# Patient Record
Sex: Male | Born: 1960 | Race: Black or African American | Hispanic: No | Marital: Single | Smoking: Never smoker
Health system: Southern US, Community
[De-identification: ages and names within clinical notes are randomized; demographics above are authoritative.]

## PROBLEM LIST (undated history)

## (undated) DIAGNOSIS — I10 Essential (primary) hypertension: Secondary | ICD-10-CM

## (undated) HISTORY — PX: KNEE SURGERY: SHX244

---

## 2015-10-19 DIAGNOSIS — R972 Elevated prostate specific antigen [PSA]: Secondary | ICD-10-CM | POA: Insufficient documentation

## 2016-09-28 DIAGNOSIS — M48061 Spinal stenosis, lumbar region without neurogenic claudication: Secondary | ICD-10-CM | POA: Insufficient documentation

## 2016-09-28 DIAGNOSIS — I10 Essential (primary) hypertension: Secondary | ICD-10-CM | POA: Insufficient documentation

## 2016-09-28 DIAGNOSIS — J45909 Unspecified asthma, uncomplicated: Secondary | ICD-10-CM | POA: Insufficient documentation

## 2016-09-28 DIAGNOSIS — G4733 Obstructive sleep apnea (adult) (pediatric): Secondary | ICD-10-CM | POA: Insufficient documentation

## 2016-09-28 DIAGNOSIS — K589 Irritable bowel syndrome without diarrhea: Secondary | ICD-10-CM | POA: Insufficient documentation

## 2016-10-24 DIAGNOSIS — N179 Acute kidney failure, unspecified: Secondary | ICD-10-CM | POA: Insufficient documentation

## 2016-10-24 DIAGNOSIS — E669 Obesity, unspecified: Secondary | ICD-10-CM | POA: Insufficient documentation

## 2016-10-24 DIAGNOSIS — N400 Enlarged prostate without lower urinary tract symptoms: Secondary | ICD-10-CM | POA: Insufficient documentation

## 2016-10-28 DIAGNOSIS — E119 Type 2 diabetes mellitus without complications: Secondary | ICD-10-CM | POA: Insufficient documentation

## 2017-11-02 DIAGNOSIS — I219 Acute myocardial infarction, unspecified: Secondary | ICD-10-CM

## 2017-11-02 HISTORY — DX: Acute myocardial infarction, unspecified: I21.9

## 2018-02-08 ENCOUNTER — Encounter: Payer: Self-pay | Admitting: Emergency Medicine

## 2018-02-08 ENCOUNTER — Other Ambulatory Visit: Payer: Self-pay

## 2018-02-08 ENCOUNTER — Emergency Department: Payer: Federal, State, Local not specified - PPO

## 2018-02-08 ENCOUNTER — Inpatient Hospital Stay
Admission: EM | Admit: 2018-02-08 | Discharge: 2018-02-10 | DRG: 247 | Disposition: A | Discharge status: Home / Self Care | Payer: Federal, State, Local not specified - PPO | Attending: Internal Medicine | Admitting: Internal Medicine

## 2018-02-08 DIAGNOSIS — R7989 Other specified abnormal findings of blood chemistry: Secondary | ICD-10-CM

## 2018-02-08 DIAGNOSIS — Z8249 Family history of ischemic heart disease and other diseases of the circulatory system: Secondary | ICD-10-CM | POA: Diagnosis not present

## 2018-02-08 DIAGNOSIS — I255 Ischemic cardiomyopathy: Secondary | ICD-10-CM | POA: Diagnosis present

## 2018-02-08 DIAGNOSIS — I10 Essential (primary) hypertension: Secondary | ICD-10-CM | POA: Diagnosis present

## 2018-02-08 DIAGNOSIS — Z885 Allergy status to narcotic agent status: Secondary | ICD-10-CM

## 2018-02-08 DIAGNOSIS — E1165 Type 2 diabetes mellitus with hyperglycemia: Secondary | ICD-10-CM | POA: Diagnosis present

## 2018-02-08 DIAGNOSIS — I200 Unstable angina: Secondary | ICD-10-CM

## 2018-02-08 DIAGNOSIS — Z6837 Body mass index (BMI) 37.0-37.9, adult: Secondary | ICD-10-CM | POA: Diagnosis not present

## 2018-02-08 DIAGNOSIS — Z79899 Other long term (current) drug therapy: Secondary | ICD-10-CM

## 2018-02-08 DIAGNOSIS — R079 Chest pain, unspecified: Secondary | ICD-10-CM | POA: Diagnosis present

## 2018-02-08 DIAGNOSIS — I43 Cardiomyopathy in diseases classified elsewhere: Secondary | ICD-10-CM | POA: Diagnosis present

## 2018-02-08 DIAGNOSIS — R778 Other specified abnormalities of plasma proteins: Secondary | ICD-10-CM

## 2018-02-08 DIAGNOSIS — I2510 Atherosclerotic heart disease of native coronary artery without angina pectoris: Secondary | ICD-10-CM | POA: Diagnosis present

## 2018-02-08 DIAGNOSIS — I214 Non-ST elevation (NSTEMI) myocardial infarction: Principal | ICD-10-CM | POA: Diagnosis present

## 2018-02-08 DIAGNOSIS — Z955 Presence of coronary angioplasty implant and graft: Secondary | ICD-10-CM | POA: Diagnosis not present

## 2018-02-08 HISTORY — DX: Essential (primary) hypertension: I10

## 2018-02-08 LAB — BASIC METABOLIC PANEL
Anion gap: 6 (ref 5–15)
BUN: 12 mg/dL (ref 6–20)
CALCIUM: 9.8 mg/dL (ref 8.9–10.3)
CO2: 30 mmol/L (ref 22–32)
CREATININE: 1.1 mg/dL (ref 0.61–1.24)
Chloride: 101 mmol/L (ref 101–111)
GFR calc Af Amer: >60 mL/min (ref >60)
GLUCOSE: 187 mg/dL — AB (ref 65–99)
Potassium: 3.8 mmol/L (ref 3.5–5.1)
SODIUM: 137 mmol/L (ref 135–145)

## 2018-02-08 LAB — TROPONIN I
TROPONIN I: 0.11 ng/mL — AB (ref <0.03)
Troponin I: 0.15 ng/mL (ref <0.03)
Troponin I: 0.14 ng/mL (ref <0.03)
Troponin I: 0.12 ng/mL (ref <0.03)

## 2018-02-08 LAB — GLUCOSE, CAPILLARY
Glucose-Capillary: 130 mg/dL — ABNORMAL HIGH (ref 65–99)
Glucose-Capillary: 123 mg/dL — ABNORMAL HIGH (ref 65–99)
Glucose-Capillary: 117 mg/dL — ABNORMAL HIGH (ref 65–99)

## 2018-02-08 LAB — CBC
HEMATOCRIT: 42.8 % (ref 40.0–52.0)
Hemoglobin: 14.5 g/dL (ref 13.0–18.0)
MCH: 28.2 pg (ref 26.0–34.0)
MCHC: 34 g/dL (ref 32.0–36.0)
MCV: 82.9 fL (ref 80.0–100.0)
PLATELETS: 247 10*3/uL (ref 150–440)
RBC: 5.16 MIL/uL (ref 4.40–5.90)
RDW: 13.1 % (ref 11.5–14.5)
WBC: 7.5 10*3/uL (ref 3.8–10.6)

## 2018-02-08 LAB — HEMOGLOBIN A1C
Hgb A1c MFr Bld: 8.1 % — ABNORMAL HIGH (ref 4.8–5.6)
Mean Plasma Glucose: 185.77 mg/dL

## 2018-02-08 LAB — TSH: TSH: 2.629 u[IU]/mL (ref 0.350–4.500)

## 2018-02-08 MED ORDER — NITROGLYCERIN 2 % TD OINT
1.0000 [in_us] | TOPICAL_OINTMENT | Freq: Once | TRANSDERMAL | Status: AC
  Administered 2018-02-08: 1 [in_us] via TOPICAL
  Filled 2018-02-08: qty 1

## 2018-02-08 MED ORDER — LOSARTAN POTASSIUM 25 MG PO TABS
25.0000 mg | ORAL_TABLET | Freq: Every day | ORAL | Status: DC
  Administered 2018-02-08 – 2018-02-10 (×2): 25 mg via ORAL
  Filled 2018-02-08 (×2): qty 1

## 2018-02-08 MED ORDER — ENOXAPARIN SODIUM 150 MG/ML ~~LOC~~ SOLN
1.0000 mg/kg | Freq: Once | SUBCUTANEOUS | Status: AC
Start: 2018-02-08 — End: 2018-02-08
  Administered 2018-02-08: 135 mg via SUBCUTANEOUS
  Filled 2018-02-08: qty 0.89

## 2018-02-08 MED ORDER — ACETAMINOPHEN 650 MG RE SUPP: 650.0000 mg | Freq: Four times a day (QID) | RECTAL | Status: DC | PRN

## 2018-02-08 MED ORDER — NITROGLYCERIN 0.4 MG SL SUBL
SUBLINGUAL_TABLET | SUBLINGUAL | Status: AC
  Administered 2018-02-08: 0.4 mg via SUBLINGUAL
  Filled 2018-02-08: qty 1

## 2018-02-08 MED ORDER — METOPROLOL TARTRATE 25 MG PO TABS: 25.0000 mg | ORAL_TABLET | Freq: Two times a day (BID) | ORAL | Status: DC

## 2018-02-08 MED ORDER — ONDANSETRON HCL 4 MG/2ML IJ SOLN: 4.0000 mg | Freq: Four times a day (QID) | INTRAMUSCULAR | Status: DC | PRN

## 2018-02-08 MED ORDER — ATORVASTATIN CALCIUM 20 MG PO TABS
40.0000 mg | ORAL_TABLET | Freq: Every day | ORAL | Status: DC
  Administered 2018-02-08 – 2018-02-09 (×2): 40 mg via ORAL
  Filled 2018-02-08 (×2): qty 2

## 2018-02-08 MED ORDER — METOPROLOL TARTRATE 50 MG PO TABS
50.0000 mg | ORAL_TABLET | Freq: Two times a day (BID) | ORAL | Status: DC
  Administered 2018-02-08 – 2018-02-10 (×4): 50 mg via ORAL
  Filled 2018-02-08 (×4): qty 1

## 2018-02-08 MED ORDER — ACETAMINOPHEN 325 MG PO TABS
650.0000 mg | ORAL_TABLET | Freq: Four times a day (QID) | ORAL | Status: DC | PRN
  Administered 2018-02-08: 650 mg via ORAL
  Filled 2018-02-08: qty 2

## 2018-02-08 MED ORDER — SODIUM CHLORIDE 0.9 % IV SOLN: INTRAVENOUS | Status: DC

## 2018-02-08 MED ORDER — DOCUSATE SODIUM 100 MG PO CAPS
100.0000 mg | ORAL_CAPSULE | Freq: Two times a day (BID) | ORAL | Status: DC
  Filled 2018-02-08 (×2): qty 1

## 2018-02-08 MED ORDER — ASPIRIN 81 MG PO CHEW
324.0000 mg | CHEWABLE_TABLET | Freq: Once | ORAL | Status: AC
  Administered 2018-02-08: 324 mg via ORAL
  Filled 2018-02-08: qty 4

## 2018-02-08 MED ORDER — NITROGLYCERIN 0.4 MG SL SUBL
0.4000 mg | SUBLINGUAL_TABLET | SUBLINGUAL | Status: DC | PRN
  Administered 2018-02-08: 0.4 mg via SUBLINGUAL
  Filled 2018-02-08: qty 1

## 2018-02-08 MED ORDER — INSULIN ASPART 100 UNIT/ML ~~LOC~~ SOLN: 0.0000 [IU] | Freq: Three times a day (TID) | SUBCUTANEOUS | Status: DC

## 2018-02-08 MED ORDER — ONDANSETRON HCL 4 MG PO TABS: 4.0000 mg | ORAL_TABLET | Freq: Four times a day (QID) | ORAL | Status: DC | PRN

## 2018-02-08 MED ORDER — NITROGLYCERIN 2 % TD OINT
0.5000 [in_us] | TOPICAL_OINTMENT | Freq: Once | TRANSDERMAL | Status: AC
  Administered 2018-02-08: 0.5 [in_us] via TOPICAL
  Filled 2018-02-08: qty 1

## 2018-02-08 NOTE — H&P (Signed)
Jeffrey Knight is an 57 y.o. male.   Chief Complaint: Chest pain HPI: Patient with past medical history of hypertension presents to the emergency department complaining of chest pain.  The patient states that this is not new but that it has become more frequent.  He becomes very short of breath even with minimal exertion.  The patient reports that ascending 1 flight of stairs is more than enough to elicit centralized chest pain.  It does not radiate nor does he have any associated nausea, vomiting or diaphoresis.  Upon arrival to the emergency department the patient's blood pressure was very high.  Nitropaste was applied to his chest which improved his pressure some and began to relieve his chest pain.  Troponin was found to be elevated and the patient some EKG changes.  Therapeutic Lovenox was started and the hospitalist service contacted for further management.  Past Medical History:  Diagnosis Date  . Hypertension     Past Surgical History:  Procedure Laterality Date  . KNEE SURGERY Left     Family History  Problem Relation Age of Onset  . CAD Mother   . CAD Father    Social History:  reports that he has never smoked. He has never used smokeless tobacco. He reports that he drank alcohol. His drug history is not on file.  Allergies:  Allergies  Allergen Reactions  . Codeine Nausea And Vomiting    Medications Prior to Admission  Medication Sig Dispense Refill  . losartan (COZAAR) 25 MG tablet Take 25 mg by mouth daily.      Results for orders placed or performed during the hospital encounter of 02/08/18 (from the past 48 hour(s))  Basic metabolic panel     Status: Abnormal   Collection Time: 02/08/18  1:17 AM  Result Value Ref Range   Sodium 137 135 - 145 mmol/L   Potassium 3.8 3.5 - 5.1 mmol/L   Chloride 101 101 - 111 mmol/L   CO2 30 22 - 32 mmol/L   Glucose, Bld 187 (H) 65 - 99 mg/dL   BUN 12 6 - 20 mg/dL   Creatinine, Ser 1.10 0.61 - 1.24 mg/dL   Calcium 9.8 8.9 - 10.3  mg/dL   GFR calc non Af Amer >60 >60 mL/min   GFR calc Af Amer >60 >60 mL/min    Comment: (NOTE) The eGFR has been calculated using the CKD EPI equation. This calculation has not been validated in all clinical situations. eGFR's persistently <60 mL/min signify possible Chronic Kidney Disease.    Anion gap 6 5 - 15    Comment: Performed at [facility], [address] [place], [place] [address]  CBC     Status: None   Collection Time: 02/08/18  1:17 AM  Result Value Ref Range   WBC 7.5 3.8 - 10.6 K/uL   RBC 5.16 4.40 - 5.90 MIL/uL   Hemoglobin 14.5 13.0 - 18.0 g/dL   HCT 42.8 40.0 - 52.0 %   MCV 82.9 80.0 - 100.0 fL   MCH 28.2 26.0 - 34.0 pg   MCHC 34.0 32.0 - 36.0 g/dL   RDW 13.1 11.5 - 14.5 %   Platelets 247 150 - 440 K/uL    Comment: Performed at [facility], [address] [place], [place] [address]  Troponin I     Status: Abnormal   Collection Time: 02/08/18  1:17 AM  Result Value Ref Range   Troponin I 0.15 (HH) <0.03 ng/mL    Comment:  CRITICAL RESULT CALLED TO, READ BACK BY AND VERIFIED WITH ANDREA BRYANT ON 02/08/18 AT 0224 JAG Performed at [facility], [address] [place], [place] [address]   TSH     Status: None   Collection Time: 02/08/18  1:17 AM  Result Value Ref Range   TSH 2.629 0.350 - 4.500 uIU/mL    Comment: Performed by a 3rd Generation assay with a functional sensitivity of <=0.01 uIU/mL. Performed at [facility], [address] [place], [place] [address]    Dg Chest 2 View  Result Date: 02/08/2018 CLINICAL DATA:  Midsternal chest pain x2 days EXAM: CHEST - 2 VIEW COMPARISON:  None FINDINGS: Mild cardiac enlargement. No aortic aneurysm. Clear lungs with minimal left basilar atelectasis. No effusion or pneumothorax. Degenerative change along the dorsal spine. IMPRESSION: Mild cardiomegaly without active pulmonary disease. Electronically Signed   By: Ashley Royalty M.D.   On: 02/08/2018 02:43    Review of  Systems  Constitutional: Negative for chills and fever.  HENT: Negative for sore throat and tinnitus.   Eyes: Negative for blurred vision and redness.  Respiratory: Positive for shortness of breath. Negative for cough.   Cardiovascular: Positive for chest pain. Negative for palpitations, orthopnea and PND.  Gastrointestinal: Negative for abdominal pain, diarrhea, nausea and vomiting.  Genitourinary: Negative for dysuria, frequency and urgency.  Musculoskeletal: Negative for joint pain and myalgias.  Skin: Negative for rash.       No lesions  Neurological: Negative for speech change, focal weakness and weakness.  Endo/Heme/Allergies: Does not bruise/bleed easily.       No temperature intolerance  Psychiatric/Behavioral: Negative for depression and suicidal ideas.    Blood pressure (!) 166/106, pulse 81, temperature 97.8 F (36.6 C), temperature source Oral, resp. rate (!) 22, height _0  (1.88 m), weight 133.8 kg (294 lb 14.4 oz), SpO2 100 %. Physical Exam  Vitals reviewed. Constitutional: He is oriented to person, place, and time. He appears well-developed and well-nourished. No distress.  HENT:  Head: Normocephalic and atraumatic.  Mouth/Throat: Oropharynx is clear and moist.  Eyes: Pupils are equal, round, and reactive to light. Conjunctivae and EOM are normal. No scleral icterus.  Neck: Normal range of motion. Neck supple. No JVD present. No tracheal deviation present. No thyromegaly present.  Cardiovascular: Normal rate, regular rhythm and normal heart sounds. Exam reveals no gallop and no friction rub.  No murmur heard. Respiratory: Effort normal and breath sounds normal. No respiratory distress.  GI: Soft. Bowel sounds are normal. He exhibits no distension. There is no tenderness.  Genitourinary:  Genitourinary Comments: Deferred  Musculoskeletal: Normal range of motion. He exhibits no edema.  Lymphadenopathy:    He has no cervical adenopathy.  Neurological: He is alert and  oriented to person, place, and time. No cranial nerve deficit.  Skin: Skin is warm and dry. No rash noted. No erythema.  Psychiatric: He has a normal mood and affect. His behavior is normal. Judgment and thought content normal.     Assessment/Plan This is a 57 year old male admitted for NSTEMI. 1.  NSTEMI: Inversions of T waves in some anterior leads as well as flattening of T waves and lateral leads in light of elevated troponin.  Continue to reduce blood pressure.  Therapeutic Lovenox for now.  The patient is n.p.o. in anticipation of possible heart catheterization.  Consult cardiology.   2.  Hypertension: Uncontrolled; continue losartan.  I have added more Nitropaste to the patient's chest.  Consider addition of a  blocker. 3.  Obesity: BMI 37.8; encouraged healthy diet and exercise. 4.  DVT prophylaxis: Repeating anticoagulation 5.  GI prophylaxis: None The patient is a full code.  Time spent on admission orders and patient care approximately 45 minutes  Harrie Foreman, MD 02/08/2018, 7:57 AM

## 2018-02-08 NOTE — ED Notes (Signed)
ED Provider at bedside. 

## 2018-02-08 NOTE — ED Notes (Signed)
Patient transported to 245 

## 2018-02-08 NOTE — Progress Notes (Signed)
[facility] [place] [facility]                                                                                                                                                                                  Patient Demographics   Jeffrey Knight, is a 57 y.o. male, DOB - January 26, 1961, ZOX:[id number]  Admit date - 02/08/2018   Admitting Physician Arnaldo Natal, MD  Outpatient Primary MD for the patient is [facility]   LOS - 0  Subjective: Patient admitted with chest pain no further chest pain He reports exertional dyspnea and chest pain   Review of Systems:   CONSTITUTIONAL: No documented fever. No fatigue, weakness. No weight gain, no weight loss.  EYES: No blurry or double vision.  ENT: No tinnitus. No postnasal drip. No redness of the oropharynx.  RESPIRATORY: No cough, no wheeze, no hemoptysis.  Positive dyspnea with activity.  CARDIOVASCULAR: Positive chest pain. No orthopnea. No palpitations. No syncope.  GASTROINTESTINAL: No nausea, no vomiting or diarrhea. No abdominal pain. No melena or hematochezia.  GENITOURINARY: No dysuria or hematuria.  ENDOCRINE: No polyuria or nocturia. No heat or cold intolerance.  HEMATOLOGY: No anemia. No bruising. No bleeding.  INTEGUMENTARY: No rashes. No lesions.  MUSCULOSKELETAL: No arthritis. No swelling. No gout.  NEUROLOGIC: No numbness, tingling, or ataxia. No seizure-type activity.  PSYCHIATRIC: No anxiety. No insomnia. No ADD.    Vitals:   Vitals:   02/08/18 0400 02/08/18 0430 02/08/18 0459 02/08/18 0800  BP: (!) 170/103 (!) 183/102 (!) 166/106 (!) 157/94  Pulse: 84 97 81 80  Resp: 12 (!) 22  18  Temp:   97.8 F (36.6 C) 97.8 F (36.6 C)  TempSrc:   Oral Oral  SpO2: 98% 96% 100% 96%  Weight:   133.8 kg (294 lb 14.4 oz)   Height:   6\' 2"  (1.88 m)     Wt Readings from Last 3 Encounters:  02/08/18 133.8 kg (294 lb 14.4 oz)     Intake/Output Summary (Last 24 hours) at 02/08/2018 1426 Last  data filed at 02/08/2018 1410 Gross per 24 hour  Intake 240 ml  Output 450 ml  Net -210 ml    Physical Exam:   GENERAL: Pleasant-appearing in no apparent distress.  HEAD, EYES, EARS, NOSE AND THROAT: Atraumatic, normocephalic. Extraocular muscles are intact. Pupils equal and reactive to light. Sclerae anicteric. No conjunctival injection. No oro-pharyngeal erythema.  NECK: Supple. There is no jugular venous distention. No bruits, no lymphadenopathy, no thyromegaly.  HEART: Regular rate and rhythm,. No murmurs, no rubs, no clicks.  LUNGS: Clear to auscultation bilaterally. No rales or rhonchi. No wheezes.  ABDOMEN:  Soft, flat, nontender, nondistended. Has good bowel sounds. No hepatosplenomegaly appreciated.  EXTREMITIES: No evidence of any cyanosis, clubbing, or peripheral edema.  +2 pedal and radial pulses bilaterally.  NEUROLOGIC: The patient is alert, awake, and oriented x3 with no focal motor or sensory deficits appreciated bilaterally.  SKIN: Moist and warm with no rashes appreciated.  Psych: Not anxious, depressed LN: No inguinal LN enlargement    Antibiotics   Anti-infectives (From admission, onward)   None      Medications   Scheduled Meds: . atorvastatin  40 mg Oral q1800  . docusate sodium  100 mg Oral BID  . insulin aspart  0-9 Units Subcutaneous TID WC  . losartan  25 mg Oral Daily  . metoprolol tartrate  25 mg Oral BID   Continuous Infusions: PRN Meds:.acetaminophen **OR** acetaminophen, ondansetron **OR** ondansetron (ZOFRAN) IV   Data Review:   Micro Results No results found for this or any previous visit (from the past 240 hour(s)).  Radiology Reports Dg Chest 2 View  Result Date: 02/08/2018 CLINICAL DATA:  Midsternal chest pain x2 days EXAM: CHEST - 2 VIEW COMPARISON:  None FINDINGS: Mild cardiac enlargement. No aortic aneurysm. Clear lungs with minimal left basilar atelectasis. No effusion or pneumothorax. Degenerative change along the dorsal spine.  IMPRESSION: Mild cardiomegaly without active pulmonary disease. Electronically Signed   By: Tollie Eth M.D.   On: 02/08/2018 02:43     CBC Recent Labs  Lab 02/08/18 0117  WBC 7.5  HGB 14.5  HCT 42.8  PLT 247  MCV 82.9  MCH 28.2  MCHC 34.0  RDW 13.1    Chemistries  Recent Labs  Lab 02/08/18 0117  NA 137  K 3.8  CL 101  CO2 30  GLUCOSE 187*  BUN 12  CREATININE 1.10  CALCIUM 9.8   ------------------------------------------------------------------------------------------------------------------ estimated creatinine clearance is 111.6 mL/min (by C-G formula based on SCr of 1.1 mg/dL). ------------------------------------------------------------------------------------------------------------------ Recent Labs    02/08/18 0117  HGBA1C 8.1*   ------------------------------------------------------------------------------------------------------------------ No results for input(s): CHOL, HDL, LDLCALC, TRIG, CHOLHDL, LDLDIRECT in the last 72 hours. ------------------------------------------------------------------------------------------------------------------ Recent Labs    02/08/18 0117  TSH 2.629   ------------------------------------------------------------------------------------------------------------------ No results for input(s): VITAMINB12, FOLATE, FERRITIN, TIBC, IRON, RETICCTPCT in the last 72 hours.  Coagulation profile No results for input(s): INR, PROTIME in the last 168 hours.  No results for input(s): DDIMER in the last 72 hours.  Cardiac Enzymes Recent Labs  Lab 02/08/18 0117 02/08/18 0947  TROPONINI 0.15* 0.14*   ------------------------------------------------------------------------------------------------------------------ Invalid input(s): POCBNP    Assessment & Plan  Patient is a 57 year old African-American male presented with chest pain  1.  NSTEMI: Continue Lovenox therapy aspirin, nitroglycerin and Lopressor cardiolgy plans to  cath him tomorrow morning 2.  Hypertension: Uncontrolled; continue losartan.    Will increase metoprolol 3.    Hyperglycemia with a hemoglobin A1c greater than 8 patient is a diabetic started on a sliding scale insulin by me will monitor his blood glucose will likely benefit from metformin on the 4.  Obesity: BMI 37.8; encouraged healthy diet and exercise. 5.  DVT prophylaxis: Lovenox     Code Status Orders  (From admission, onward)        Start     Ordered   02/08/18 0445  Full code  Continuous     02/08/18 0444    Code Status History    This patient has a current code status but no historical code status.    Advance Directive Documentation  Most Recent Value  Type of Advance Directive  Healthcare Power of Attorney, Living will  Pre-existing out of facility DNR order (yellow form or pink MOST form)  -  "MOST" Form in Place?  -           Consults cardiology  DVT Prophylaxis  Lovenox   Lab Results  Component Value Date   PLT 247 02/08/2018     Time Spent in minutes   35 minutes greater than 50% of time spent in care coordination and counseling patient regarding the condition and plan of care.   Auburn Bilberry M.D on 02/08/2018 at 2:26 PM  Between 7am to 6pm - Pager - [phone]  After 6pm go to [email or web address] - Social research officer, government  [employer]   Office  [phone]

## 2018-02-08 NOTE — ED Provider Notes (Signed)
[facility] Emergency Department Provider Note   ____________________________________________   First MD Initiated Contact with Patient 02/08/18 [phone]     (approximate)  I have reviewed the triage vital signs and the nursing notes.   HISTORY  Chief Complaint Chest Pain    HPI Jeffrey Knight is a 57 y.o. male who comes into the hospital today with chest pain.  The patient states this started a couple of days ago.  The patient was walking when it started but it went away when he sat down.  He reports that the pain has been coming and going every time he does something.  Tonight when he laid down the pain started hurting really bad in his mid chest.  The patient also had some radiation into his back.  This was around 1130.  The patient states that he gets short of breath when he is walking and a little lightheaded as well but denies any sweats nausea or vomiting.  The patient states that his pain is currently a 2 out of 10 in intensity and is never had this before.  The patient does have a history of high blood pressure but no other heart problems.  He decided to come into the hospital for evaluation.  The patient did not take any medicine at home.   Past Medical History:  Diagnosis Date  . Hypertension     There are no active problems to display for this patient.   Past Surgical History:  Procedure Laterality Date  . KNEE SURGERY Left     Prior to Admission medications   Not on File    Allergies Codeine  No family history on file.  Social History Social History   Tobacco Use  . Smoking status: Never Smoker  . Smokeless tobacco: Never Used  Substance Use Topics  . Alcohol use: Not Currently  . Drug use: Not on file    Review of Systems  Constitutional: No fever/chills Eyes: No visual changes. ENT: No sore throat. Cardiovascular:  chest pain. Respiratory: shortness of breath. Gastrointestinal: No abdominal pain.  No nausea, no  vomiting.   Genitourinary: Negative for dysuria. Musculoskeletal: Negative for back pain. Skin: Negative for rash. Neurological: Lightheadedness.   ____________________________________________   PHYSICAL EXAM:  VITAL SIGNS: ED Triage Vitals  Enc Vitals Group     BP 02/08/18 0118 (!) 192/109     Pulse Rate 02/08/18 0118 94     Resp 02/08/18 0118 18     Temp 02/08/18 0118 97.9 F (36.6 C)     Temp Source 02/08/18 0118 Oral     SpO2 02/08/18 0118 99 %     Weight 02/08/18 0114 295 lb (133.8 kg)     Height 02/08/18 0114 6\' 2"  (1.88 m)     Head Circumference --      Peak Flow --      Pain Score 02/08/18 0114 8     Pain Loc --      Pain Edu? --      Excl. in GC? --     Constitutional: Alert and oriented. Well appearing and in mild distress. Eyes: Conjunctivae are normal. PERRL. EOMI. Head: Atraumatic. Nose: No congestion/rhinnorhea. Mouth/Throat: Mucous membranes are moist.  Oropharynx non-erythematous. Cardiovascular: Normal rate, regular rhythm. Grossly normal heart sounds.  Good peripheral circulation. Respiratory: Normal respiratory effort.  No retractions. Lungs CTAB. Gastrointestinal: Soft and nontender. No distention.  Positive bowel sounds Musculoskeletal: No lower extremity tenderness nor edema.  Neurologic:  Normal speech  and language.  Skin:  Skin is warm, dry and intact.  Psychiatric: Mood and affect are normal.   ____________________________________________   LABS (all labs ordered are listed, but only abnormal results are displayed)  Labs Reviewed  BASIC METABOLIC PANEL - Abnormal; Notable for the following components:      Result Value   Glucose, Bld 187 (*)    All other components within normal limits  TROPONIN I - Abnormal; Notable for the following components:   Troponin I 0.15 (*)    All other components within normal limits  CBC   ____________________________________________  EKG  ED ECG REPORT I, Rebecka Apley, the attending  physician, personally viewed and interpreted this ECG.   Date: 02/08/2018  EKG Time: 117  Rate: 90  Rhythm: normal sinus rhythm  Axis: normal  Intervals:none  ST&T Change: Flipped T waves in lead I, II and aVL with T wave flattening to lead III and aVF, flipped T waves as well in lead V5 and V6. No comparisons available  ____________________________________________  RADIOLOGY  ED MD interpretation: Chest x-ray: Mild cardiomegaly without active pulmonary disease  Official radiology report(s): Dg Chest 2 View  Result Date: 02/08/2018 CLINICAL DATA:  Midsternal chest pain x2 days EXAM: CHEST - 2 VIEW COMPARISON:  None FINDINGS: Mild cardiac enlargement. No aortic aneurysm. Clear lungs with minimal left basilar atelectasis. No effusion or pneumothorax. Degenerative change along the dorsal spine. IMPRESSION: Mild cardiomegaly without active pulmonary disease. Electronically Signed   By: Tollie Eth M.D.   On: 02/08/2018 02:43    ____________________________________________   PROCEDURES  Procedure(s) performed: None  Procedures  Critical Care performed: No  ____________________________________________   INITIAL IMPRESSION / ASSESSMENT AND PLAN / ED COURSE  As part of my medical decision making, I reviewed the following data within the electronic MEDICAL RECORD NUMBER Notes from prior ED visits and [place] Controlled Substance Database   This is a 57 year old male who comes into the hospital today with some chest pain.  My differential diagnosis includes musculoskeletal pain, reflux, acute coronary syndrome, pulmonary pathology.  We did check some blood work on the patient to include a CBC, BMP and a troponin.  The patient also received an x-ray.  The patient's troponin came back at 0.15 and his glucose is 187.  The patient's chest x-ray is negative.  I will give the patient some aspirin and Nitropaste to his chest to help bring down his blood pressure and some Lovenox.  The patient will  be admitted to the hospitalist service for further evaluation.      ____________________________________________   FINAL CLINICAL IMPRESSION(S) / ED DIAGNOSES  Final diagnoses:  Unstable angina pectoris (HCC)  Elevated troponin     ED Discharge Orders    None       Note:  This document was prepared using Dragon voice recognition software and may include unintentional dictation errors.    Rebecka Apley, MD 02/08/18 [phone]

## 2018-02-08 NOTE — ED Triage Notes (Signed)
Pt to triage via w/c with no distress noted; st x 2 days having mid CP radiating into back with no accomp symptoms; denies hx of same

## 2018-02-08 NOTE — Progress Notes (Signed)
Inpatient Diabetes Program Recommendations  AACE/ADA: New Consensus Statement on Inpatient Glycemic Control (2015)  Target Ranges:  Prepandial:   less than 140 mg/dL      Peak postprandial:   less than 180 mg/dL (1-2 hours)      Critically ill patients:  140 - 180 mg/dL  Results for MAURY, GRONINGER (MRN [id number]) as of 02/08/2018 11:32  Ref. Range 02/08/2018 01:17  Glucose Latest Ref Range: 65 - 99 mg/dL 187 (H)  Hemoglobin A1C Latest Ref Range: 4.8 - 5.6 % 8.1 (H)    Review of Glycemic Control  Diabetes history: No Outpatient Diabetes medications: NA Current orders for Inpatient glycemic control: None  Inpatient Diabetes Program Recommendations:  Correction (SSI): While inpatient, please consider ordering CBGs with Novolog correction scale ACHS. HgbA1C: A1C 8.1% on 02/07/18 indicating an average glucose of 186 mg/dl over the past 2-3 months. In reviewing the chart in [place], patient's A1C was 8.1% on 12/19/2016 and 9.2% on 10/25/16. Per ADA, if A1C is 6.5% or greater, then criteria met to dx with DM. MD, please indicate in note if patient will be newly dx with DM. If so, please inform patient and bedside nursing so patient can be educated while inpatient. Diet: Please add Carb Modified to Heart Healthy diet.  NOTE: Spoke with patient over the phone to inquire about any DM history. Patient reports that he has never been told he had DM and reports no family history of DM. Informed patient that his initial glucose was elevated and an A1C was ordered which is elevated as well. Informed patient that note would be made in the chart to reflect and MD will determine if patient will be dx with DM at this time or not.   Thanks, Barnie Alderman, RN, MSN, CDE Diabetes Coordinator Inpatient Diabetes Program [phone] (Team Pager from 8am to 5pm)

## 2018-02-08 NOTE — Consult Note (Signed)
[facility] Cardiology  CARDIOLOGY CONSULT NOTE  Patient ID: Jeffrey Knight MRN: [id number] DOB/AGE: 55-Sep-1962 55 y.o.  Admit date: 02/08/2018 Referring Physician [place] Primary Physician [facility] Primary Cardiologist  Reason for Consultation non-STEMI  HPI: 57 year old gentleman referred for evaluation of non-STEMI.  The patient reports that he was in his usual state of health until 2 days ago when he first experienced exertional chest pain while walking.  He describes substernal chest tightness, relieved with rest.  The patient had recurrent chest pain at rest last evening, and presented to [facility] ED.  ECG revealed sinus rhythm with T wave inversions in leads V5 and V6.  Troponin is elevated 0.15 and 0.14.  Review of systems complete and found to be negative unless listed above     Past Medical History:  Diagnosis Date  . Hypertension     Past Surgical History:  Procedure Laterality Date  . KNEE SURGERY Left     Medications Prior to Admission  Medication Sig Dispense Refill Last Dose  . losartan (COZAAR) 25 MG tablet Take 25 mg by mouth daily.   02/07/2018 at Unknown time   Social History   Socioeconomic History  . Marital status: Single    Spouse name: Not on file  . Number of children: Not on file  . Years of education: Not on file  . Highest education level: Not on file  Occupational History  . Not on file  Social Needs  . Financial resource strain: Not on file  . Food insecurity:    Worry: Not on file    Inability: Not on file  . Transportation needs:    Medical: Not on file    Non-medical: Not on file  Tobacco Use  . Smoking status: Never Smoker  . Smokeless tobacco: Never Used  Substance and Sexual Activity  . Alcohol use: Not Currently  . Drug use: Not on file  . Sexual activity: Not on file  Lifestyle  . Physical activity:    Days per week: Not on file    Minutes per session: Not on file  . Stress: Not on file  Relationships  . Social connections:    Talks  on phone: Not on file    Gets together: Not on file    Attends religious service: Not on file    Active member of club or organization: Not on file    Attends meetings of clubs or organizations: Not on file    Relationship status: Not on file  . Intimate partner violence:    Fear of current or ex partner: Not on file    Emotionally abused: Not on file    Physically abused: Not on file    Forced sexual activity: Not on file  Other Topics Concern  . Not on file  Social History Narrative  . Not on file    Family History  Problem Relation Age of Onset  . CAD Mother   . CAD Father       Review of systems complete and found to be negative unless listed above      PHYSICAL EXAM  General: Well developed, well nourished, in no acute distress HEENT:  Normocephalic and atramatic Neck:  No JVD.  Lungs: Clear bilaterally to auscultation and percussion. Heart: HRRR . Normal S1 and S2 without gallops or murmurs.  Abdomen: Bowel sounds are positive, abdomen soft and non-tender  Msk:  Back normal, normal gait. Normal strength and tone for age. Extremities: No clubbing, cyanosis or edema.  Neuro: Alert and oriented X 3. Psych:  Good affect, responds appropriately  Labs:   Lab Results  Component Value Date   WBC 7.5 02/08/2018   HGB 14.5 02/08/2018   HCT 42.8 02/08/2018   MCV 82.9 02/08/2018   PLT 247 02/08/2018    Recent Labs  Lab 02/08/18 0117  NA 137  K 3.8  CL 101  CO2 30  BUN 12  CREATININE 1.10  CALCIUM 9.8  GLUCOSE 187*   Lab Results  Component Value Date   TROPONINI 0.14 (HH) 02/08/2018   No results found for: CHOL No results found for: HDL No results found for: LDLCALC No results found for: TRIG No results found for: CHOLHDL No results found for: LDLDIRECT    Radiology: Dg Chest 2 View  Result Date: 02/08/2018 CLINICAL DATA:  Midsternal chest pain x2 days EXAM: CHEST - 2 VIEW COMPARISON:  None FINDINGS: Mild cardiac enlargement. No aortic aneurysm.  Clear lungs with minimal left basilar atelectasis. No effusion or pneumothorax. Degenerative change along the dorsal spine. IMPRESSION: Mild cardiomegaly without active pulmonary disease. Electronically Signed   By: Tollie Eth M.D.   On: 02/08/2018 02:43    EKG: Normal sinus rhythm, with lateral T wave inversions  ASSESSMENT AND PLAN:   1.  New onset chest pain, with exertion and at rest, with abnormal ECG, and elevated troponin, consistent with unstable angina/non-ST elevation myocardial infarction  Recommendations  1.  Agree with current therapy 2.  Add metoprolol tartrate 25 mg twice daily 3.  If the patient has recurrent chest pain start heparin drip 4.  Cardiac catheterization with selective coronary arteriography scheduled for 02/09/2018.  The risks, benefits alternatives of cardiac catheterization and possible PCI were explained to the patient and informed consent was obtained.  Signed: Marcina Millard MD,PhD, [facility] 02/08/2018, 1:03 PM

## 2018-02-08 NOTE — ED Notes (Signed)
Patient is resting comfortably at this time with no signs of distress present. Equal, unlabored rise and fall of chest noted within normal rate. VS stable. Will continue to monitor.   

## 2018-02-09 ENCOUNTER — Inpatient Hospital Stay
Admit: 2018-02-09 | Discharge: 2018-02-09 | Disposition: A | Discharge status: Home / Self Care | Payer: Federal, State, Local not specified - PPO | Attending: Internal Medicine | Admitting: Internal Medicine

## 2018-02-09 ENCOUNTER — Encounter
Admission: EM | Disposition: A | Discharge status: Home / Self Care | Payer: Self-pay | Source: Home / Self Care | Attending: Internal Medicine

## 2018-02-09 DIAGNOSIS — I214 Non-ST elevation (NSTEMI) myocardial infarction: Secondary | ICD-10-CM

## 2018-02-09 DIAGNOSIS — Z9889 Other specified postprocedural states: Secondary | ICD-10-CM | POA: Insufficient documentation

## 2018-02-09 HISTORY — PX: LEFT HEART CATH AND CORONARY ANGIOGRAPHY: CATH118249

## 2018-02-09 HISTORY — PX: CORONARY STENT INTERVENTION: CATH118234

## 2018-02-09 LAB — ECHOCARDIOGRAM COMPLETE
HEIGHTINCHES: 74 in
Weight: 4677.28 oz

## 2018-02-09 LAB — GLUCOSE, CAPILLARY
Glucose-Capillary: 120 mg/dL — ABNORMAL HIGH (ref 65–99)
Glucose-Capillary: 87 mg/dL (ref 65–99)
Glucose-Capillary: 143 mg/dL — ABNORMAL HIGH (ref 65–99)

## 2018-02-09 LAB — LIPID PANEL
CHOL/HDL RATIO: 5 ratio
CHOLESTEROL: 180 mg/dL (ref 0–200)
HDL: 36 mg/dL — ABNORMAL LOW (ref >40)
LDL CALC: 106 mg/dL — AB (ref 0–99)
Triglycerides: 189 mg/dL — ABNORMAL HIGH (ref <150)
VLDL: 38 mg/dL (ref 0–40)

## 2018-02-09 LAB — POCT ACTIVATED CLOTTING TIME
Activated Clotting Time: 224 seconds
Activated Clotting Time: 230 seconds
Activated Clotting Time: 252 seconds

## 2018-02-09 SURGERY — LEFT HEART CATH AND CORONARY ANGIOGRAPHY
Anesthesia: Moderate Sedation

## 2018-02-09 MED ORDER — ASPIRIN 81 MG PO CHEW
81.0000 mg | CHEWABLE_TABLET | ORAL | Status: AC
  Administered 2018-02-09: 81 mg via ORAL
  Filled 2018-02-09: qty 1

## 2018-02-09 MED ORDER — NITROGLYCERIN 5 MG/ML IV SOLN
INTRAVENOUS | Status: AC
  Filled 2018-02-09: qty 10

## 2018-02-09 MED ORDER — CLOPIDOGREL BISULFATE 75 MG PO TABS
ORAL_TABLET | ORAL | Status: AC
  Filled 2018-02-09: qty 8

## 2018-02-09 MED ORDER — MIDAZOLAM HCL 2 MG/2ML IJ SOLN
INTRAMUSCULAR | Status: DC | PRN
  Administered 2018-02-09: 0.5 mg via INTRAVENOUS
  Administered 2018-02-09: 1 mg via INTRAVENOUS
  Administered 2018-02-09: 0.5 mg via INTRAVENOUS

## 2018-02-09 MED ORDER — CLOPIDOGREL BISULFATE 75 MG PO TABS
75.0000 mg | ORAL_TABLET | Freq: Every day | ORAL | Status: DC
  Administered 2018-02-10: 75 mg via ORAL
  Filled 2018-02-09: qty 1

## 2018-02-09 MED ORDER — SODIUM CHLORIDE 0.9% FLUSH: 3.0000 mL | INTRAVENOUS | Status: DC | PRN

## 2018-02-09 MED ORDER — ONDANSETRON HCL 4 MG/2ML IJ SOLN: 4.0000 mg | Freq: Four times a day (QID) | INTRAMUSCULAR | Status: DC | PRN

## 2018-02-09 MED ORDER — MIDAZOLAM HCL 2 MG/2ML IJ SOLN
INTRAMUSCULAR | Status: AC
  Filled 2018-02-09: qty 2

## 2018-02-09 MED ORDER — ASPIRIN 81 MG PO CHEW
81.0000 mg | CHEWABLE_TABLET | Freq: Every day | ORAL | Status: DC
  Administered 2018-02-09 – 2018-02-10 (×2): 81 mg via ORAL
  Filled 2018-02-09: qty 1

## 2018-02-09 MED ORDER — VERAPAMIL HCL 2.5 MG/ML IV SOLN
INTRAVENOUS | Status: DC | PRN
  Administered 2018-02-09: 2.5 mg via INTRA_ARTERIAL

## 2018-02-09 MED ORDER — HEPARIN SODIUM (PORCINE) 1000 UNIT/ML IJ SOLN
INTRAMUSCULAR | Status: AC
  Filled 2018-02-09: qty 1

## 2018-02-09 MED ORDER — TAMSULOSIN HCL 0.4 MG PO CAPS
0.4000 mg | ORAL_CAPSULE | Freq: Once | ORAL | Status: AC
  Administered 2018-02-09: 0.4 mg via ORAL
  Filled 2018-02-09: qty 1

## 2018-02-09 MED ORDER — HYDRALAZINE HCL 20 MG/ML IJ SOLN
5.0000 mg | INTRAMUSCULAR | Status: AC | PRN
  Administered 2018-02-09 (×2): 5 mg via INTRAVENOUS

## 2018-02-09 MED ORDER — HEPARIN (PORCINE) IN NACL 2-0.9 UNIT/ML-% IJ SOLN
INTRAMUSCULAR | Status: AC
  Filled 2018-02-09: qty 1000

## 2018-02-09 MED ORDER — SODIUM CHLORIDE 0.9 % WEIGHT BASED INFUSION
1.0000 mL/kg/h | INTRAVENOUS | Status: AC
  Administered 2018-02-09: 1 mL/kg/h via INTRAVENOUS

## 2018-02-09 MED ORDER — LABETALOL HCL 5 MG/ML IV SOLN: 10.0000 mg | INTRAVENOUS | Status: AC | PRN

## 2018-02-09 MED ORDER — SODIUM CHLORIDE 0.9 % WEIGHT BASED INFUSION: 1.0000 mL/kg/h | INTRAVENOUS | Status: DC

## 2018-02-09 MED ORDER — FENTANYL CITRATE (PF) 100 MCG/2ML IJ SOLN
INTRAMUSCULAR | Status: DC | PRN
  Administered 2018-02-09: 25 ug via INTRAVENOUS
  Administered 2018-02-09: 50 ug via INTRAVENOUS
  Administered 2018-02-09: 25 ug via INTRAVENOUS

## 2018-02-09 MED ORDER — FENTANYL CITRATE (PF) 100 MCG/2ML IJ SOLN
INTRAMUSCULAR | Status: AC
  Filled 2018-02-09: qty 2

## 2018-02-09 MED ORDER — LIVING WELL WITH DIABETES BOOK
Freq: Once | Status: AC
  Administered 2018-02-09: 16:00:00
  Filled 2018-02-09: qty 1

## 2018-02-09 MED ORDER — HYDRALAZINE HCL 20 MG/ML IJ SOLN
INTRAMUSCULAR | Status: AC
  Administered 2018-02-09: 11:00:00
  Filled 2018-02-09: qty 1

## 2018-02-09 MED ORDER — PERFLUTREN LIPID MICROSPHERE
1.0000 mL | INTRAVENOUS | Status: AC | PRN
  Administered 2018-02-09: 3 mL via INTRAVENOUS
  Filled 2018-02-09: qty 10

## 2018-02-09 MED ORDER — CLOPIDOGREL BISULFATE 75 MG PO TABS
ORAL_TABLET | ORAL | Status: DC | PRN
  Administered 2018-02-09: 600 mg via ORAL

## 2018-02-09 MED ORDER — ACETAMINOPHEN 325 MG PO TABS: 650.0000 mg | ORAL_TABLET | ORAL | Status: DC | PRN

## 2018-02-09 MED ORDER — VERAPAMIL HCL 2.5 MG/ML IV SOLN
INTRAVENOUS | Status: AC
  Filled 2018-02-09: qty 2

## 2018-02-09 MED ORDER — IOPAMIDOL (ISOVUE-300) INJECTION 61%
INTRAVENOUS | Status: DC | PRN
  Administered 2018-02-09: 265 mL via INTRA_ARTERIAL

## 2018-02-09 MED ORDER — TAMSULOSIN HCL 0.4 MG PO CAPS
0.4000 mg | ORAL_CAPSULE | Freq: Every day | ORAL | Status: DC
  Administered 2018-02-09 – 2018-02-10 (×2): 0.4 mg via ORAL
  Filled 2018-02-09 (×2): qty 1

## 2018-02-09 MED ORDER — SODIUM CHLORIDE 0.9% FLUSH
3.0000 mL | Freq: Two times a day (BID) | INTRAVENOUS | Status: DC
  Administered 2018-02-10: 3 mL via INTRAVENOUS

## 2018-02-09 MED ORDER — SODIUM CHLORIDE 0.9 % WEIGHT BASED INFUSION
3.0000 mL/kg/h | INTRAVENOUS | Status: AC
  Administered 2018-02-09: 3 mL/kg/h via INTRAVENOUS

## 2018-02-09 MED ORDER — SODIUM CHLORIDE 0.9 % WEIGHT BASED INFUSION
1.0000 mL/kg/h | INTRAVENOUS | Status: DC
  Administered 2018-02-09: 1 mL/kg/h via INTRAVENOUS

## 2018-02-09 MED ORDER — SODIUM CHLORIDE 0.9 % IV SOLN: 250.0000 mL | INTRAVENOUS | Status: DC | PRN

## 2018-02-09 MED ORDER — ASPIRIN 81 MG PO CHEW: 81.0000 mg | CHEWABLE_TABLET | ORAL | Status: DC

## 2018-02-09 MED ORDER — HEPARIN SODIUM (PORCINE) 1000 UNIT/ML IJ SOLN
INTRAMUSCULAR | Status: DC | PRN
  Administered 2018-02-09 (×2): 3000 [IU] via INTRAVENOUS
  Administered 2018-02-09: 4000 [IU] via INTRAVENOUS
  Administered 2018-02-09: 6500 [IU] via INTRAVENOUS

## 2018-02-09 MED ORDER — SODIUM CHLORIDE 0.9 % WEIGHT BASED INFUSION: 3.0000 mL/kg/h | INTRAVENOUS | Status: DC

## 2018-02-09 MED ORDER — SODIUM CHLORIDE 0.9% FLUSH
3.0000 mL | Freq: Two times a day (BID) | INTRAVENOUS | Status: DC
  Administered 2018-02-09: 3 mL via INTRAVENOUS

## 2018-02-09 MED ORDER — NITROGLYCERIN 1 MG/10 ML FOR IR/CATH LAB
INTRA_ARTERIAL | Status: DC | PRN
  Administered 2018-02-09: 200 ug via INTRACORONARY

## 2018-02-09 MED ORDER — ASPIRIN 81 MG PO CHEW
CHEWABLE_TABLET | ORAL | Status: AC
  Filled 2018-02-09: qty 3

## 2018-02-09 MED ORDER — ASPIRIN 81 MG PO CHEW
CHEWABLE_TABLET | ORAL | Status: DC | PRN
  Administered 2018-02-09: 243 mg via ORAL

## 2018-02-09 SURGICAL SUPPLY — 16 items
BALLN TREK RX 2.5X15 (BALLOONS) ×3
BALLN ~~LOC~~ TREK RX 3.5X20 (BALLOONS) ×3
BALLOON TREK RX 2.5X15 (BALLOONS) ×1 IMPLANT
BALLOON ~~LOC~~ TREK RX 3.5X20 (BALLOONS) ×1 IMPLANT
CATH 5F 110X4 TIG (CATHETERS) ×3 IMPLANT
CATH INFINITI 5FR ANG PIGTAIL (CATHETERS) ×3 IMPLANT
CATH VISTA GUIDE 6FR JR4 (CATHETERS) ×3 IMPLANT
DEVICE INFLAT 30 PLUS (MISCELLANEOUS) ×3 IMPLANT
DEVICE RAD COMP TR BAND LRG (VASCULAR PRODUCTS) ×3 IMPLANT
GLIDESHEATH SLEND A-KIT 6F 22G (SHEATH) IMPLANT
KIT MANI 3VAL PERCEP (MISCELLANEOUS) ×3 IMPLANT
PACK CARDIAC CATH (CUSTOM PROCEDURE TRAY) ×3 IMPLANT
SHEATH RAIN RADIAL 21G 6FR (SHEATH) ×3 IMPLANT
STENT SIERRA 3.00 X 23 MM (Permanent Stent) ×3 IMPLANT
WIRE ASAHI PROWATER 180CM (WIRE) ×3 IMPLANT
WIRE ROSEN-J .035X260CM (WIRE) ×3 IMPLANT

## 2018-02-09 NOTE — Progress Notes (Signed)
Inpatient Diabetes Program Recommendations  AACE/ADA: New Consensus Statement on Inpatient Glycemic Control (2015)  Target Ranges:  Prepandial:   less than 140 mg/dL      Peak postprandial:   less than 180 mg/dL (1-2 hours)      Critically ill patients:  140 - 180 mg/dL  Results for ABDULQADIR, Jeffrey Knight (MRN [id number]) as of 02/09/2018 14:53  Ref. Range 02/08/2018 11:50 02/08/2018 16:41 02/08/2018 20:55 02/09/2018 07:59  Glucose-Capillary Latest Ref Range: 65 - 99 mg/dL 725 (H) 366 (H) 440 (H) 120 (H)   Results for Jeffrey Knight, Jeffrey Knight (MRN [id number]) as of 02/09/2018 14:53  Ref. Range 02/08/2018 01:17  Hemoglobin A1C Latest Ref Range: 4.8 - 5.6 % 8.1 (H)   Review of Glycemic Control  Diabetes history: No Outpatient Diabetes medications: NA Current orders for Inpatient glycemic control: None  Inpatient Diabetes Program Recommendations:  HgbA1C: A1C 8.1% on 02/07/18 indicating an average glucose of 186 mg/dl over the past 2-3 months. In reviewing the chart in Care Everywhere, patient's A1C was 8.1% on 12/19/2016 and 9.2% on 10/25/16. Per MD notes, patient is being newly dx with DM at this time. Bedside nursing please use each patient interaction to provide DM education.   NOTE: Spoke with patient over the phone on 02/08/18 to inquire about any DM history. Patient reported that he has never been told he had DM and reports no family history of DM. Informed patient that his initial glucose was elevated and an A1C was ordered which is elevated as well. Noted MD note that patient will be newly dx with DM during this admission.  Patient has been in cath lab today and had stent placed. Spoke with RN and asked that Living Well with Diabetes book be provided to patient and asked to read the book before Inpatient Diabetes Coordinator sees patient tomorrow on 02/10/18.   Thanks, Orlando Penner, RN, MSN, CDE Diabetes Coordinator Inpatient Diabetes Program [phone] (Team Pager from 8am to 5pm)

## 2018-02-09 NOTE — Progress Notes (Signed)
Dr. Darrold Junker in briefly to speak with pt. & spouse. Pt. Denies any cardiac or subjective c/o at present.

## 2018-02-09 NOTE — Progress Notes (Signed)
[employer] - [place] at [facility]                                                                                                                                                                                  Patient Demographics   Jeffrey Knight, is a 57 y.o. male, DOB - November 08, 1960, ZOX:[id number]  Admit date - 02/08/2018   Admitting Physician Arnaldo Natal, MD  Outpatient Primary MD for the patient is [facility]   LOS - 1  Subjective: Patient seen post cath had a stent placed to the RCA Currently asymptomatic  Review of Systems:   CONSTITUTIONAL: No documented fever. No fatigue, weakness. No weight gain, no weight loss.  EYES: No blurry or double vision.  ENT: No tinnitus. No postnasal drip. No redness of the oropharynx.  RESPIRATORY: No cough, no wheeze, no hemoptysis.  Positive dyspnea with activity.  CARDIOVASCULAR: Positive chest pain. No orthopnea. No palpitations. No syncope.  GASTROINTESTINAL: No nausea, no vomiting or diarrhea. No abdominal pain. No melena or hematochezia.  GENITOURINARY: No dysuria or hematuria.  ENDOCRINE: No polyuria or nocturia. No heat or cold intolerance.  HEMATOLOGY: No anemia. No bruising. No bleeding.  INTEGUMENTARY: No rashes. No lesions.  MUSCULOSKELETAL: No arthritis. No swelling. No gout.  NEUROLOGIC: No numbness, tingling, or ataxia. No seizure-type activity.  PSYCHIATRIC: No anxiety. No insomnia. No ADD.    Vitals:   Vitals:   02/09/18 1300 02/09/18 1330 02/09/18 1345 02/09/18 1429  BP: (!) 154/85   (!) 152/98  Pulse: 86 88 88 94  Resp: 10  14 18   Temp:    98 F (36.7 C)  TempSrc:    Oral  SpO2: 99%   99%  Weight:      Height:        Wt Readings from Last 3 Encounters:  02/09/18 132.6 kg (292 lb 5.3 oz)     Intake/Output Summary (Last 24 hours) at 02/09/2018 1500 Last data filed at 02/09/2018 1100 Gross per 24 hour  Intake 240 ml  Output 1075 ml  Net -835 ml    Physical Exam:    GENERAL: Pleasant-appearing in no apparent distress.  HEAD, EYES, EARS, NOSE AND THROAT: Atraumatic, normocephalic. Extraocular muscles are intact. Pupils equal and reactive to light. Sclerae anicteric. No conjunctival injection. No oro-pharyngeal erythema.  NECK: Supple. There is no jugular venous distention. No bruits, no lymphadenopathy, no thyromegaly.  HEART: Regular rate and rhythm,. No murmurs, no rubs, no clicks.  LUNGS: Clear to auscultation bilaterally. No rales or rhonchi. No wheezes.  ABDOMEN: Soft, flat, nontender, nondistended. Has good bowel sounds. No hepatosplenomegaly appreciated.  EXTREMITIES: No evidence of any cyanosis,  clubbing, or peripheral edema.  +2 pedal and radial pulses bilaterally.  NEUROLOGIC: The patient is alert, awake, and oriented x3 with no focal motor or sensory deficits appreciated bilaterally.  SKIN: Moist and warm with no rashes appreciated.  Psych: Not anxious, depressed LN: No inguinal LN enlargement    Antibiotics   Anti-infectives (From admission, onward)   None      Medications   Scheduled Meds: . aspirin  81 mg Oral Daily  . atorvastatin  40 mg Oral q1800  . [START ON 02/10/2018] clopidogrel  75 mg Oral Q breakfast  . docusate sodium  100 mg Oral BID  . insulin aspart  0-9 Units Subcutaneous TID WC  . living well with diabetes book   Does not apply Once  . losartan  25 mg Oral Daily  . metoprolol tartrate  50 mg Oral BID  . sodium chloride flush  3 mL Intravenous Q12H   Continuous Infusions: . sodium chloride    . sodium chloride     PRN Meds:.sodium chloride, acetaminophen **OR** acetaminophen, acetaminophen, hydrALAZINE, labetalol, nitroGLYCERIN, ondansetron **OR** ondansetron (ZOFRAN) IV, ondansetron (ZOFRAN) IV, sodium chloride flush   Data Review:   Micro Results No results found for this or any previous visit (from the past 240 hour(s)).  Radiology Reports Dg Chest 2 View  Result Date: 02/08/2018 CLINICAL DATA:   Midsternal chest pain x2 days EXAM: CHEST - 2 VIEW COMPARISON:  None FINDINGS: Mild cardiac enlargement. No aortic aneurysm. Clear lungs with minimal left basilar atelectasis. No effusion or pneumothorax. Degenerative change along the dorsal spine. IMPRESSION: Mild cardiomegaly without active pulmonary disease. Electronically Signed   By: Tollie Eth M.D.   On: 02/08/2018 02:43     CBC Recent Labs  Lab 02/08/18 0117  WBC 7.5  HGB 14.5  HCT 42.8  PLT 247  MCV 82.9  MCH 28.2  MCHC 34.0  RDW 13.1    Chemistries  Recent Labs  Lab 02/08/18 0117  NA 137  K 3.8  CL 101  CO2 30  GLUCOSE 187*  BUN 12  CREATININE 1.10  CALCIUM 9.8   ------------------------------------------------------------------------------------------------------------------ estimated creatinine clearance is 111.2 mL/min (by C-G formula based on SCr of 1.1 mg/dL). ------------------------------------------------------------------------------------------------------------------ Recent Labs    02/08/18 0117  HGBA1C 8.1*   ------------------------------------------------------------------------------------------------------------------ Recent Labs    02/09/18 0431  CHOL 180  HDL 36*  LDLCALC 106*  TRIG 189*  CHOLHDL 5.0   ------------------------------------------------------------------------------------------------------------------ Recent Labs    02/08/18 0117  TSH 2.629   ------------------------------------------------------------------------------------------------------------------ No results for input(s): VITAMINB12, FOLATE, FERRITIN, TIBC, IRON, RETICCTPCT in the last 72 hours.  Coagulation profile No results for input(s): INR, PROTIME in the last 168 hours.  No results for input(s): DDIMER in the last 72 hours.  Cardiac Enzymes Recent Labs  Lab 02/08/18 0947 02/08/18 1637 02/08/18 2301  TROPONINI 0.14* 0.12* 0.11*    ------------------------------------------------------------------------------------------------------------------ Invalid input(s): POCBNP    Assessment & Plan  Patient is a 57 year old African-American male presented with chest pain  1.  NSTEMI:  Continue aspirin, Plavix and metoprolol  2.  Hypertension: Uncontrolled; increase Cozaar and metoprolol 3.    Hyperglycemia with a hemoglobin A1c greater than 8 patient is a diabetic started on a sliding scale insulin by me will monitor his blood glucose will likely benefit from metformin on discharge 4.  Obesity: BMI 37.8; encouraged healthy diet and exercise. 5.  DVT prophylaxis: Lovenox     Code Status Orders  (From admission, onward)  Start     Ordered   02/08/18 0445  Full code  Continuous     02/08/18 0444    Code Status History    This patient has a current code status but no historical code status.    Advance Directive Documentation     Most Recent Value  Type of Advance Directive  Healthcare Power of Attorney, Living will  Pre-existing out of facility DNR order (yellow form or pink MOST form)  -  "MOST" Form in Place?  -           Consults cardiology  DVT Prophylaxis  Lovenox   Lab Results  Component Value Date   PLT 247 02/08/2018     Time Spent in minutes   35 minutes greater than 50% of time spent in care coordination and counseling patient regarding the condition and plan of care.   Auburn Bilberry M.D on 02/09/2018 at 3:00 PM  Between 7am to 6pm - Pager - [phone]  After 6pm go to [email or web address] - Social research officer, government  [employer]   Office  [phone]

## 2018-02-09 NOTE — Progress Notes (Signed)
Pt reports 7/10 chest pain after lying on his left side. Pts BP 183/122. SL nitro given. MD Anne Hahn made aware. EKG and one inch nitro paste ordered. Pt pain subsided to 2/10 and BP decreased to 143/96. Will continue to monitor.

## 2018-02-10 ENCOUNTER — Encounter: Payer: Self-pay | Admitting: Cardiology

## 2018-02-10 DIAGNOSIS — Z955 Presence of coronary angioplasty implant and graft: Secondary | ICD-10-CM

## 2018-02-10 LAB — CBC
HCT: 38.6 % — ABNORMAL LOW (ref 40.0–52.0)
Hemoglobin: 13 g/dL (ref 13.0–18.0)
MCH: 28.6 pg (ref 26.0–34.0)
MCHC: 33.7 g/dL (ref 32.0–36.0)
MCV: 84.7 fL (ref 80.0–100.0)
PLATELETS: 235 10*3/uL (ref 150–440)
RBC: 4.55 MIL/uL (ref 4.40–5.90)
RDW: 13.1 % (ref 11.5–14.5)
WBC: 7.7 10*3/uL (ref 3.8–10.6)

## 2018-02-10 LAB — BASIC METABOLIC PANEL
Anion gap: 5 (ref 5–15)
BUN: 12 mg/dL (ref 6–20)
CHLORIDE: 104 mmol/L (ref 101–111)
CO2: 27 mmol/L (ref 22–32)
CREATININE: 1.22 mg/dL (ref 0.61–1.24)
Calcium: 8.6 mg/dL — ABNORMAL LOW (ref 8.9–10.3)
GFR calc Af Amer: >60 mL/min (ref >60)
GLUCOSE: 129 mg/dL — AB (ref 65–99)
POTASSIUM: 3.8 mmol/L (ref 3.5–5.1)
Sodium: 136 mmol/L (ref 135–145)

## 2018-02-10 LAB — GLUCOSE, CAPILLARY: Glucose-Capillary: 114 mg/dL — ABNORMAL HIGH (ref 65–99)

## 2018-02-10 MED ORDER — BLOOD GLUCOSE MONITOR KIT: PACK | 0 refills | Status: AC

## 2018-02-10 MED ORDER — ASPIRIN 81 MG PO CHEW: 81.0000 mg | CHEWABLE_TABLET | Freq: Every day | ORAL | Status: DC

## 2018-02-10 MED ORDER — NITROGLYCERIN 0.4 MG SL SUBL: 0.4000 mg | SUBLINGUAL_TABLET | SUBLINGUAL | 12 refills | Status: AC | PRN

## 2018-02-10 MED ORDER — LOSARTAN POTASSIUM 25 MG PO TABS: 25.0000 mg | ORAL_TABLET | Freq: Every day | ORAL | 0 refills | Status: AC

## 2018-02-10 MED ORDER — CLOPIDOGREL BISULFATE 75 MG PO TABS: 75.0000 mg | ORAL_TABLET | Freq: Every day | ORAL | 2 refills | Status: DC

## 2018-02-10 MED ORDER — ATORVASTATIN CALCIUM 40 MG PO TABS: 40.0000 mg | ORAL_TABLET | Freq: Every day | ORAL | 0 refills | Status: DC

## 2018-02-10 MED ORDER — CLOPIDOGREL BISULFATE 75 MG PO TABS: 75.0000 mg | ORAL_TABLET | Freq: Every day | ORAL | 2 refills | Status: AC

## 2018-02-10 MED ORDER — METOPROLOL TARTRATE 50 MG PO TABS: 50.0000 mg | ORAL_TABLET | Freq: Two times a day (BID) | ORAL | 2 refills | Status: DC

## 2018-02-10 MED ORDER — METFORMIN HCL 500 MG PO TABS: 500.0000 mg | ORAL_TABLET | Freq: Two times a day (BID) | ORAL | 11 refills | Status: DC

## 2018-02-10 MED ORDER — NITROGLYCERIN 0.4 MG SL SUBL: 0.4000 mg | SUBLINGUAL_TABLET | SUBLINGUAL | 12 refills | Status: DC | PRN

## 2018-02-10 MED ORDER — ATORVASTATIN CALCIUM 40 MG PO TABS: 40.0000 mg | ORAL_TABLET | Freq: Every day | ORAL | 0 refills | Status: AC

## 2018-02-10 MED ORDER — METFORMIN HCL 500 MG PO TABS: 500.0000 mg | ORAL_TABLET | Freq: Two times a day (BID) | ORAL | 11 refills | Status: AC

## 2018-02-10 MED ORDER — METOPROLOL TARTRATE 50 MG PO TABS: 50.0000 mg | ORAL_TABLET | Freq: Two times a day (BID) | ORAL | 2 refills | Status: AC

## 2018-02-10 MED ORDER — ASPIRIN 81 MG PO CHEW: 81.0000 mg | CHEWABLE_TABLET | Freq: Every day | ORAL | Status: AC

## 2018-02-10 NOTE — Progress Notes (Signed)
[employer] - [place] at [facility]    Doniven Kamm was admitted to the Hospital on 02/08/2018 and Discharged  02/10/2018 and should be excused from work/school   for 14 days starting 02/08/2018 , may return to work/school without any restrictions.  Call Auburn Bilberry MD with questions.  Auburn Bilberry M.D on 02/10/2018,at 1:03 PM  [employer] - [place] at [facility]  [phone]

## 2018-02-10 NOTE — Progress Notes (Signed)
Nutrition Education Note  RD consulted for nutrition education regarding a Heart Healthy diet.   57 y/o male with NSTEMI   Lipid Panel     Component Value Date/Time   CHOL 180 02/09/2018 0431   TRIG 189 (H) 02/09/2018 0431   HDL 36 (L) 02/09/2018 0431   CHOLHDL 5.0 02/09/2018 0431   VLDL 38 02/09/2018 0431   LDLCALC 106 (H) 02/09/2018 0431    RD provided "Heart Healthy Nutrition Therapy" handout from the Academy of Nutrition and Dietetics. Reviewed patient's dietary recall. Provided examples on ways to decrease sodium and fat intake in diet. Discouraged intake of processed foods and use of salt shaker. Encouraged fresh fruits and vegetables as well as whole grain sources of carbohydrates to maximize fiber intake. Teach back method used.  Expect poor compliance.  Pt meets criteria for obesity based on current BMI.  Current diet order is HH, patient is consuming approximately 100% of meals at this time. Labs and medications reviewed. No further nutrition interventions warranted at this time. RD contact information provided. If additional nutrition issues arise, please re-consult RD.  Betsey Holiday MS, RD, LDN Pager #- [phone] After Hours Pager: [phone]

## 2018-02-10 NOTE — Progress Notes (Signed)
Pt discharged home with wife, IV's removed, pt education completed, for radial site care, and discharge instructions, prescriptions in hand, no complications or complaints at this time. VSS.

## 2018-02-10 NOTE — Progress Notes (Signed)
Inpatient Diabetes Program Recommendations  AACE/ADA: New Consensus Statement on Inpatient Glycemic Control (2015)  Target Ranges:  Prepandial:   less than 140 mg/dL      Peak postprandial:   less than 180 mg/dL (1-2 hours)      Critically ill patients:  140 - 180 mg/dL   Results for Jeffrey Knight, Jeffrey Knight (MRN [id number]) as of 02/10/2018 12:07  Ref. Range 02/08/2018 01:17  Hemoglobin A1C Latest Ref Range: 4.8 - 5.6 % 8.1 (H)   Review of Glycemic Control  Diabetes history:No Outpatient Diabetes medications:NA Current orders for Inpatient glycemic control:None  Inpatient Diabetes Program Recommendations: HgbA1C: A1C 8.1% on 4/8/19indicating an average glucose of 186mg /dl over the past 2-3 months. In reviewing the chart in Care Everywhere, patient's A1C was 8.1% on 12/19/2016 and 9.2% on 10/25/16. Per MD notes, patient is being newly dx with DM at this time. Bedside nursing please use each patient interaction to provide DM education.   NOTE: Spoke with patient about new diabetes diagnosis.  Discussed A1C results (8.1% on 02/08/18) and explained what an A1C is and informed patient that his current A1C indicates an average glucose of 186 mg/dl over the past 2-3 months. Discussed basic pathophysiology of DM Type 2, basic home care, importance of checking CBGs and maintaining good CBG control to prevent long-term and short-term complications. Reviewed glucose and A1C goals.  Reviewed signs and symptoms of hyperglycemia and hypoglycemia along with treatment for both. Discussed impact of nutrition, exercise, stress, sickness, and medications on diabetes control. Reviewed Living Well with diabetes booklet and encouraged patient to read through entire book.  Asked patient to check his glucose as MD directs and to keep a log book of glucose readings.  Explained how the doctor he follows up with can use the log book to continue to make DM medication adjustments if needed.  Patient verbalized understanding of  information discussed and he states that he has no further questions at this time related to diabetes.  Patient will be discharged home today.  Thanks, Orlando Penner, RN, MSN, CDE Diabetes Coordinator Inpatient Diabetes Program [phone] (Team Pager from 8am to 5pm)

## 2018-02-10 NOTE — Progress Notes (Signed)
[facility] Cardiology  SUBJECTIVE: Patient laying in bed, denies chest pain or shortness of breath   Vitals:   02/09/18 1345 02/09/18 1429 02/09/18 2023 02/10/18 0320  BP:  (!) 152/98 (!) 172/91 132/82  Pulse: 88 94 (!) 104 91  Resp: 14 18 18 18   Temp:  98 F (36.7 C) 99.2 F (37.3 C) 98.4 F (36.9 C)  TempSrc:  Oral Oral Oral  SpO2:  99% 98% 99%  Weight:    132.1 kg (291 lb 4.8 oz)  Height:         Intake/Output Summary (Last 24 hours) at 02/10/2018 0754 Last data filed at 02/10/2018 0300 Gross per 24 hour  Intake 240 ml  Output 275 ml  Net -35 ml      PHYSICAL EXAM  General: Well developed, well nourished, in no acute distress HEENT:  Normocephalic and atramatic Neck:  No JVD.  Lungs: Clear bilaterally to auscultation and percussion. Heart: HRRR . Normal S1 and S2 without gallops or murmurs.  Abdomen: Bowel sounds are positive, abdomen soft and non-tender  Msk:  Back normal, normal gait. Normal strength and tone for age. Extremities: No clubbing, cyanosis or edema.   Neuro: Alert and oriented X 3. Psych:  Good affect, responds appropriately   LABS: Basic Metabolic Panel: Recent Labs    02/08/18 0117 02/10/18 0522  NA 137 136  K 3.8 3.8  CL 101 104  CO2 30 27  GLUCOSE 187* 129*  BUN 12 12  CREATININE 1.10 1.22  CALCIUM 9.8 8.6*   Liver Function Tests: No results for input(s): AST, ALT, ALKPHOS, BILITOT, PROT, ALBUMIN in the last 72 hours. No results for input(s): LIPASE, AMYLASE in the last 72 hours. CBC: Recent Labs    02/08/18 0117 02/10/18 0522  WBC 7.5 7.7  HGB 14.5 13.0  HCT 42.8 38.6*  MCV 82.9 84.7  PLT 247 235   Cardiac Enzymes: Recent Labs    02/08/18 0947 02/08/18 1637 02/08/18 2301  TROPONINI 0.14* 0.12* 0.11*   BNP: Invalid input(s): POCBNP D-Dimer: No results for input(s): DDIMER in the last 72 hours. Hemoglobin A1C: Recent Labs    02/08/18 0117  HGBA1C 8.1*   Fasting Lipid Panel: Recent Labs    02/09/18 0431  CHOL  180  HDL 36*  LDLCALC 106*  TRIG 189*  CHOLHDL 5.0   Thyroid Function Tests: Recent Labs    02/08/18 0117  TSH 2.629   Anemia Panel: No results for input(s): VITAMINB12, FOLATE, FERRITIN, TIBC, IRON, RETICCTPCT in the last 72 hours.  No results found.   Echo pending  TELEMETRY: Normal sinus rhythm:  ASSESSMENT AND PLAN:  Active Problems:   NSTEMI (non-ST elevated myocardial infarction) (HCC)    1.  Non-ST elevation myocardial infarction with cardiac catheterization revealing 95% stenosis mid RCA, underwent successful PCI with DES mid RCA with excellent angiographic result 2.  Mild to moderate ischemic cardiomyopathy, with LVEF 35-40%  Recommendations  1.  Continue current medications 2.  Dual antiplatelet therapy uninterrupted for 1 year 3.  Review 2D echocardiogram 4.  May discharge home today 5.  Follow-up as outpatient in 1 week   Marcina Millard, MD, PhD, [facility] 02/10/2018 7:54 AM

## 2018-02-10 NOTE — Discharge Summary (Signed)
[facility] [place] at [facility], 57 y.o., DOB Sep 14, 1961, MRN [id number]. Admission date: 02/08/2018 Discharge Date 02/10/2018 [facility], [place] Admitting Physician Harrie Foreman, MD  Admission Diagnosis  Unstable angina pectoris ([place]) [I20.0] Elevated troponin [R74.8]  Discharge Diagnosis   Active Problems:   NSTEMI (non-ST elevated myocardial infarction) [facility]) Essential hypertension Hyperglycemia tach due to type 2 diabetes Hyperlipidemia Morbid obesity     Hospital Course Patient is 57 year old African-American male presented with chest pain.  To have acute non-ST MI.  Patient was started on heparin drip.  And subsequently taken to cardiac Cath Lab.  He was noted to have a RCA occlusion which was stented.  Patient will be on dual antiplatelet therapy.  He was also noticed to have hypoglycemia and his blood work revealed him to be a diabetic.  Patient had dietary and diabetic counseling done.  He will need outpatient follow-up with cardiology.             Consults cardiology  Significant Tests:  See full reports for all details    Dg Chest 2 View  Result Date: 02/08/2018 CLINICAL DATA:  Midsternal chest pain x2 days EXAM: CHEST - 2 VIEW COMPARISON:  None FINDINGS: Mild cardiac enlargement. No aortic aneurysm. Clear lungs with minimal left basilar atelectasis. No effusion or pneumothorax. Degenerative change along the dorsal spine. IMPRESSION: Mild cardiomegaly without active pulmonary disease. Electronically Signed   By: Ashley Royalty M.D.   On: 02/08/2018 02:43       Today   Subjective:   Jeffrey Knight feeling well denies any chest pains  Objective:   Blood pressure (!) 150/85, pulse 87, temperature 98.3 F (36.8 C), temperature source Oral, resp. rate 18, height _0  (1.88 m), weight 132.1 kg (291 lb 4.8 oz), SpO2 100 %.  .  Intake/Output Summary (Last 24 hours) at 02/10/2018 1847 Last data filed at  02/10/2018 0830 Gross per 24 hour  Intake 243 ml  Output 100 ml  Net 143 ml    Exam VITAL SIGNS: Blood pressure (!) 150/85, pulse 87, temperature 98.3 F (36.8 C), temperature source Oral, resp. rate 18, height _1  (1.88 m), weight 132.1 kg (291 lb 4.8 oz), SpO2 100 %.  GENERAL:  57 y.o.-year-old patient lying in the bed with no acute distress.  EYES: Pupils equal, round, reactive to light and accommodation. No scleral icterus. Extraocular muscles intact.  HEENT: Head atraumatic, normocephalic. Oropharynx and nasopharynx clear.  NECK:  Supple, no jugular venous distention. No thyroid enlargement, no tenderness.  LUNGS: Normal breath sounds bilaterally, no wheezing, rales,rhonchi or crepitation. No use of accessory muscles of respiration.  CARDIOVASCULAR: S1, S2 normal. No murmurs, rubs, or gallops.  ABDOMEN: Soft, nontender, nondistended. Bowel sounds present. No organomegaly or mass.  EXTREMITIES: No pedal edema, cyanosis, or clubbing.  NEUROLOGIC: Cranial nerves II through XII are intact. Muscle strength 5/5 in all extremities. Sensation intact. Gait not checked.  PSYCHIATRIC: The patient is alert and oriented x 3.  SKIN: No obvious rash, lesion, or ulcer.   Data Review     CBC w Diff:  Lab Results  Component Value Date   WBC 7.7 02/10/2018   HGB 13.0 02/10/2018   HCT 38.6 (L) 02/10/2018   PLT 235 02/10/2018   CMP:  Lab Results  Component Value Date   NA 136 02/10/2018   K 3.8 02/10/2018   CL 104 02/10/2018   CO2 27 02/10/2018   BUN 12 02/10/2018  CREATININE 1.22 02/10/2018  .  Micro Results No results found for this or any previous visit (from the past 240 hour(s)).   Code Status History    Date Active Date Inactive Code Status Order ID Comments User Context   02/08/2018 0444 02/10/2018 1634 Full Code [id number]  Harrie Foreman, MD ED    Advance Directive Documentation     Most Recent Value  Type of Advance Directive  Healthcare Power of Attorney, Living  will  Pre-existing out of facility DNR order (yellow form or pink MOST form)  -  "MOST" Form in Place?  -          [place] In 1 week.   Specialty:  General Practice Why:  hosp f/u..... OFFICE WILL CALL PATIENT TO SCHEDULE APPOINTMENT. Contact information: [address] [place] [address] [phone]        Isaias Cowman, MD. Go on 03/01/2018.   Specialty:  Cardiology Why:  _0 :45PM... cad, s/p stent Contact information: [address] [facility] [place] [address] [phone]           Discharge Medications   Allergies as of 02/10/2018      Reactions   Codeine Nausea And Vomiting      Medication List    TAKE these medications   aspirin 81 MG chewable tablet Chew 1 tablet (81 mg total) by mouth daily. Start taking on:  02/11/2018   atorvastatin 40 MG tablet Commonly known as:  LIPITOR Take 1 tablet (40 mg total) by mouth daily at 6 PM.   blood glucose meter kit and supplies Kit Dispense based on patient and insurance preference. Use up to four times daily as directed. (FOR ICD-9 250.00, 250.01).   clopidogrel 75 MG tablet Commonly known as:  PLAVIX Take 1 tablet (75 mg total) by mouth daily with breakfast. Start taking on:  02/11/2018   losartan 25 MG tablet Commonly known as:  COZAAR Take 1 tablet (25 mg total) by mouth daily.   metFORMIN 500 MG tablet Commonly known as:  GLUCOPHAGE Take 1 tablet (500 mg total) by mouth 2 (two) times daily with a meal.   metoprolol tartrate 50 MG tablet Commonly known as:  LOPRESSOR Take 1 tablet (50 mg total) by mouth 2 (two) times daily.   nitroGLYCERIN 0.4 MG SL tablet Commonly known as:  NITROSTAT Place 1 tablet (0.4 mg total) under the tongue every 5 (five) minutes as needed for chest pain.          Total Time in preparing paper work, data evaluation and todays exam - 51 minutes  Dustin Flock M.D on 02/10/2018 at 6:47  PM [place]  [phone]

## 2018-02-10 NOTE — Progress Notes (Signed)
Cardiac Nurse Navigator Note Patient is a 57 year old African-American male presented with chest pain.  Patient is a NEVER smoker.  Patient reports that he was only on Losartan prior to admission.    Active Problem List: 1.NSTEMI:  Continue aspirin, Plavix and metoprolol  2. Hypertension: Uncontrolled; increase Cozaar and metoprolol 3. Hyperglycemia with a hemoglobin A1c greater than 8 patient is a diabetic.   4.  Obesity: BMI 37.8; encouraged healthy diet and exercise. 5.  DVT prophylaxis  Procedures - 02/09/2018   CORONARY STENT INTERVENTION  LEFT HEART CATH AND CORONARY ANGIOGRAPHY  Conclusion     Prox LAD lesion is 50% stenosed.  Ost 1st Diag lesion is 60% stenosed.  Prox LAD to Mid LAD lesion is 30% stenosed.  Prox RCA to Mid RCA lesion is 95% stenosed.  Dist RCA-1 lesion is 30% stenosed.  Dist RCA-2 lesion is 50% stenosed.  A drug-eluting stent was successfully placed using a STENT SIERRA 3.00 X 23 MM.  Post intervention, there is a 0% residual stenosis.   1.  One-vessel CAD with high-grade stenosis mid RCA 2.  Mild to moderate cardiomyopathy with estimated LV ejection fraction 35-40% 3.  Successful PCI with DES mid RCA    NSTEMI Education completed with patient:   Patient informed this RN that he had not been informed that he had a "heart attack."  He stated, "That's there first time I have been told this."  I explained the determining factors and the diagnosis of NSTEMI.    "Heart Attack Bouncing Back" booklet given and reviewed with patient. Discussed the definition of CAD. Reviewed the location CAD and where stents were placed. Patient has stent card. Explained the purpose of the stent card. Instructed patient to keep stent card in his wallet.   Discussed modifiable risk factors including controlling blood pressure, cholesterol, and blood sugar; following heart healthy diet; maintaining healthy weight; exercise; and smoking cessation, if applicable.  ?Note: Patient is a NEVER smoker.   Discussed cardiac medications including rationale for taking, mechanisms of action, and side effects. Stressed the importance of taking medications as prescribed.   Discussed emergency plan for heart attack symptoms. Patient verbalized understanding of need to call 911 and not to drive himself to ER if having cardiac symptoms / chest pain.   Heart healthy diet of low sodium, low fat, low cholesterol carb modified heart healthy diet discussed. Information on diet provided. Dietitian Consultation pending.  Patient reported that he was working on "cleaning up" his diet prior to admission.    Smoking Cessation - Patient is a NEVER smoker.    Exercise - Patient reported he had just started working out and had been working out for the past month PTA.  In fact, patient noticed he would have chest pain when he was walking at the gym.  Explained to patient that he had been been referred to Cardiac Rehab. Overview of the program provided. Patient interested in the program. Patient is a Cytogeneticist.  Patient was previously being followed at the [facility] in [place], but is transitioning to the [facility] in [place].  Patient has his first appointment at the [facility] in [place] on May 1.  Patient also has Media planner.  Discussed cost for Cardiac Rehab program.  Informational letter, CPT Billing Codes, and Cardiac Rehab brochure with department phone number provided to patient. Patient plans to contact [employer] to see what the co-pay or out-of-pocket expenses will be for Cardiac Rehab so he can make a decision about when he can  start Cardiac Rehab. Patient stated, "I definitely want to participate. I just need to check with Human Resources where I work."  I explained to patient that the Cardiac Brewing technologist will be contacting the patient in the next week to set up an orientation appointment to get him started in the program.     Smoking Cessation - Patient is a NEVER smoker.     Patient thanked me for providing the above information.   Army Melia, RN, BSN, [facility] Cardiovascular and Pulmonary Nurse Navigator

## 2018-02-10 NOTE — Discharge Instructions (Signed)
MI / NSTEMI Discharge Checklist  Aspirin prescribed at discharge:   Yes   High Intensity Statin Prescribed? (Lipitor 40-80mg  or Crestor 20-40mg ):Yes   Beta Blocker Prescribed: Yes   ADP Receptor Inhibitor Prescribed? (i.e. Plavix etc.- Includes Medically Managed Patients): Yes   Was EF assessed during THIS hospitalization?  Plan to f/u on echo done in hospital,  (YES = Measured in current episode of care or document plan to evaluate after discharge.)  Was EF < 40%?   no For EF < 40%, was ACE/ARB prescribed?  For EF < 40%, was Aldosterone Inhibitor prescribed?   (If contraindicated, provide explanation.)    Was Cardiac Rehab Phase II ordered prior to discharge? (Includes Medically Managed Patients): Yes

## 2018-02-10 NOTE — Plan of Care (Signed)
  Problem: Clinical Measurements: Goal: Ability to maintain clinical measurements within normal limits will improve Outcome: Progressing Goal: Cardiovascular complication will be avoided Outcome: Progressing   Problem: Pain Managment: Goal: General experience of comfort will improve Outcome: Progressing   Problem: Education: Goal: Understanding of cardiac disease, CV risk reduction, and recovery process will improve Outcome: Progressing   Problem: Activity: Goal: Ability to tolerate increased activity will improve Outcome: Progressing   Problem: Cardiac: Goal: Ability to achieve and maintain adequate cardiovascular perfusion will improve Outcome: Progressing

## 2018-02-21 ENCOUNTER — Encounter: Payer: Self-pay | Admitting: *Deleted

## 2018-02-21 ENCOUNTER — Encounter: Payer: Federal, State, Local not specified - PPO | Attending: Cardiology | Admitting: *Deleted

## 2018-02-21 VITALS — Ht 74.25 in | Wt 288.1 lb

## 2018-02-21 DIAGNOSIS — I214 Non-ST elevation (NSTEMI) myocardial infarction: Secondary | ICD-10-CM | POA: Insufficient documentation
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
## 2018-02-21 NOTE — Patient Instructions (Signed)
Patient Instructions  Patient Details  Name: Jeffrey Knight MRN: [id number] Date of Birth: 1961-04-09 Referring Provider:  Marcina Millard, MD  Below are your personal goals for exercise, nutrition, and risk factors. Our goal is to help you stay on track towards obtaining and maintaining these goals. We will be discussing your progress on these goals with you throughout the program.  Initial Exercise Prescription: Initial Exercise Prescription - 02/21/18 1400      Date of Initial Exercise RX and Referring Provider   Date  02/21/18    Referring Provider  Paraschos, Alexander MD      Treadmill   MPH  3    Grade  1    Minutes  15    METs  3.71      Recumbant Bike   Level  3    RPM  50    Watts  85    Minutes  15    METs  3.5      T5 Nustep   Level  3    SPM  80    Minutes  15    METs  3.5      Prescription Details   Frequency (times per week)  2    Duration  Progress to 30 minutes of continuous aerobic without signs/symptoms of physical distress      Intensity   THRR 40-80% of Max Heartrate  108-147    Ratings of Perceived Exertion  11-13    Perceived Dyspnea  0-4      Progression   Progression  Continue to progress workloads to maintain intensity without signs/symptoms of physical distress.      Resistance Training   Training Prescription  Yes    Weight  4 lbs    Reps  10-15       Exercise Goals: Frequency: Be able to perform aerobic exercise two to three times per week in program working toward 2-5 days per week of home exercise.  Intensity: Work with a perceived exertion of 11 (fairly light) - 15 (hard) while following your exercise prescription.  We will make changes to your prescription with you as you progress through the program.   Duration: Be able to do 30 to 45 minutes of continuous aerobic exercise in addition to a 5 minute warm-up and a 5 minute cool-down routine.   Nutrition Goals: Your personal nutrition goals will be established when you  do your nutrition analysis with the dietician.  The following are general nutrition guidelines to follow: Cholesterol < 200mg /day Sodium < 1500mg /day Fiber: Men over 50 yrs - 30 grams per day  Personal Goals: Personal Goals and Risk Factors at Admission - 02/21/18 1357      Core Components/Risk Factors/Patient Goals on Admission    Weight Management  Yes;Obesity;Weight Loss    Intervention  Weight Management: Develop a combined nutrition and exercise program designed to reach desired caloric intake, while maintaining appropriate intake of nutrient and fiber, sodium and fats, and appropriate energy expenditure required for the weight goal.;Weight Management: Provide education and appropriate resources to help participant work on and attain dietary goals.;Weight Management/Obesity: Establish reasonable short term and long term weight goals.;Obesity: Provide education and appropriate resources to help participant work on and attain dietary goals.    Admit Weight  288 lb (130.6 kg)    Goal Weight: Short Term  283 lb (128.4 kg)    Goal Weight: Long Term  240 lb (108.9 kg)    Expected Outcomes  Long Term: Adherence  to nutrition and physical activity/exercise program aimed toward attainment of established weight goal;Short Term: Continue to assess and modify interventions until short term weight is achieved;Weight Loss: Understanding of general recommendations for a balanced deficit meal plan, which promotes 1-2 lb weight loss per week and includes a negative energy balance of [phone] kcal/d;Understanding recommendations for meals to include 15-35% energy as protein, 25-35% energy from fat, 35-60% energy from carbohydrates, less than 200mg  of dietary cholesterol, 20-35 gm of total fiber daily;Understanding of distribution of calorie intake throughout the day with the consumption of 4-5 meals/snacks    Diabetes  Yes    Intervention  Provide education about signs/symptoms and action to take for  hypo/hyperglycemia.;Provide education about proper nutrition, including hydration, and aerobic/resistive exercise prescription along with prescribed medications to achieve blood glucose in normal ranges: Fasting glucose 65-99 mg/dL    Expected Outcomes  Short Term: Participant verbalizes understanding of the signs/symptoms and immediate care of hyper/hypoglycemia, proper foot care and importance of medication, aerobic/resistive exercise and nutrition plan for blood glucose control.;Long Term: Attainment of HbA1C < 7%.    Hypertension  Yes    Intervention  Provide education on lifestyle modifcations including regular physical activity/exercise, weight management, moderate sodium restriction and increased consumption of fresh fruit, vegetables, and low fat dairy, alcohol moderation, and smoking cessation.;Monitor prescription use compliance.    Expected Outcomes  Short Term: Continued assessment and intervention until BP is < 140/26mm HG in hypertensive participants. < 130/34mm HG in hypertensive participants with diabetes, heart failure or chronic kidney disease.;Long Term: Maintenance of blood pressure at goal levels.    Lipids  Yes    Intervention  Provide education and support for participant on nutrition & aerobic/resistive exercise along with prescribed medications to achieve LDL 70mg , HDL >40mg .    Expected Outcomes  Short Term: Participant states understanding of desired cholesterol values and is compliant with medications prescribed. Participant is following exercise prescription and nutrition guidelines.;Long Term: Cholesterol controlled with medications as prescribed, with individualized exercise RX and with personalized nutrition plan. Value goals: LDL < 70mg , HDL > 40 mg.       Tobacco Use Initial Evaluation: Social History   Tobacco Use  Smoking Status Never Smoker  Smokeless Tobacco Never Used    Exercise Goals and Review: Exercise Goals    Row Name 02/21/18 1431              Exercise Goals   Increase Physical Activity  Yes       Intervention  Provide advice, education, support and counseling about physical activity/exercise needs.;Develop an individualized exercise prescription for aerobic and resistive training based on initial evaluation findings, risk stratification, comorbidities and participant's personal goals.       Expected Outcomes  Short Term: Attend rehab on a regular basis to increase amount of physical activity.;Long Term: Add in home exercise to make exercise part of routine and to increase amount of physical activity.;Long Term: Exercising regularly at least 3-5 days a week.       Increase Strength and Stamina  Yes       Intervention  Provide advice, education, support and counseling about physical activity/exercise needs.;Develop an individualized exercise prescription for aerobic and resistive training based on initial evaluation findings, risk stratification, comorbidities and participant's personal goals.       Expected Outcomes  Short Term: Increase workloads from initial exercise prescription for resistance, speed, and METs.;Short Term: Perform resistance training exercises routinely during rehab and add in resistance training at home;Long Term:  Improve cardiorespiratory fitness, muscular endurance and strength as measured by increased METs and functional capacity ( )       Able to understand and use rate of perceived exertion (RPE) scale  Yes       Intervention  Provide education and explanation on how to use RPE scale       Expected Outcomes  Short Term: Able to use RPE daily in rehab to express subjective intensity level;Long Term:  Able to use RPE to guide intensity level when exercising independently       Knowledge and understanding of Target Heart Rate Range (THRR)  Yes       Intervention  Provide education and explanation of THRR including how the numbers were predicted and where they are located for reference       Expected Outcomes  Short  Term: Able to state/look up THRR;Short Term: Able to use daily as guideline for intensity in rehab;Long Term: Able to use THRR to govern intensity when exercising independently       Able to check pulse independently  Yes       Intervention  Provide education and demonstration on how to check pulse in carotid and radial arteries.;Review the importance of being able to check your own pulse for safety during independent exercise       Expected Outcomes  Short Term: Able to explain why pulse checking is important during independent exercise;Long Term: Able to check pulse independently and accurately       Understanding of Exercise Prescription  Yes       Intervention  Provide education, explanation, and written materials on patient's individual exercise prescription       Expected Outcomes  Short Term: Able to explain program exercise prescription;Long Term: Able to explain home exercise prescription to exercise independently          Copy of goals given to participant.

## 2018-02-22 ENCOUNTER — Encounter: Payer: Federal, State, Local not specified - PPO | Admitting: *Deleted

## 2018-02-22 DIAGNOSIS — I214 Non-ST elevation (NSTEMI) myocardial infarction: Secondary | ICD-10-CM

## 2018-02-22 LAB — GLUCOSE, CAPILLARY
GLUCOSE-CAPILLARY: 123 mg/dL — AB (ref 65–99)
Glucose-Capillary: 135 mg/dL — ABNORMAL HIGH (ref 65–99)

## 2018-02-22 NOTE — Progress Notes (Signed)
Daily Session Note  Patient Details  Name: Jeffrey Knight MRN: [id number] Date of Birth: 1961/07/03 Referring Provider:     Cardiac Rehab from 02/21/2018 in [facility] Cardiac and Pulmonary Rehab  Referring Provider  Isaias Cowman MD      Encounter Date: 02/22/2018  Check In: Session Check In - 02/22/18 0831      Check-In   Location  [facility]-Cardiac & Pulmonary Rehab    Staff Present  Heath Lark, RN, BSN, CCRP;Tamzin Bertling Luan Pulling, MA, RCEP, CCRP, Exercise Physiologist;Amanda Oletta Darter, [place], ACSM CEP, Exercise Physiologist    Supervising physician immediately available to respond to emergencies  See telemetry face sheet for immediately available ER MD    Medication changes reported      No    Fall or balance concerns reported     No    Warm-up and Cool-down  Performed on first and last piece of equipment    Resistance Training Performed  Yes    VAD Patient?  No      Pain Assessment   Currently in Pain?  No/denies        Exercise Prescription Changes - 02/21/18 1400      Response to Exercise   Blood Pressure (Admit)  122/66    Blood Pressure (Exercise)  154/84    Blood Pressure (Exit)  136/70    Heart Rate (Admit)  70 bpm    Heart Rate (Exercise)  110 bpm    Heart Rate (Exit)  76 bpm    Oxygen Saturation (Admit)  99 %    Oxygen Saturation (Exercise)  98 %    Rating of Perceived Exertion (Exercise)  11    Symptoms  knees/hip pain 2-3/10    Comments  walk test results       Social History   Tobacco Use  Smoking Status Never Smoker  Smokeless Tobacco Never Used    Goals Met:  Exercise tolerated well Personal goals reviewed No report of cardiac concerns or symptoms Strength training completed today  Goals Unmet:  Not Applicable  Comments: First full day of exercise!  Patient was oriented to gym and equipment including functions, settings, policies, and procedures.  Patient's individual exercise prescription and treatment plan were reviewed.  All starting workloads were  established based on the results of the 6 minute walk test done at initial orientation visit.  The plan for exercise progression was also introduced and progression will be customized based on patient's performance and goals.    Dr. Emily Filbert is Medical Director for [place] and [facility].

## 2018-02-23 ENCOUNTER — Encounter: Payer: Self-pay | Admitting: *Deleted

## 2018-02-23 DIAGNOSIS — I214 Non-ST elevation (NSTEMI) myocardial infarction: Secondary | ICD-10-CM

## 2018-02-23 NOTE — Progress Notes (Signed)
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 Term: Continued assessment and intervention until BP is < 140/68m HG in hypertensive participants. < 130/857mHG in hypertensive participants with diabetes, heart failure or chronic kidney disease.;Long Term: Maintenance of blood pressure at goal levels.    Lipids  Yes    Intervention  Provide education and support for participant on nutrition & aerobic/resistive exercise along with prescribed medications to achieve LDL '70mg'$ , HDL >'40mg'$ .    Expected Outcomes  Short Term: Participant states understanding of desired cholesterol values and is compliant with medications prescribed. Participant is following exercise prescription and nutrition guidelines.;Long Term: Cholesterol controlled with medications as prescribed, with individualized exercise RX and with personalized nutrition plan. Value goals: LDL < '70mg'$ , HDL > 40 mg.       Core Components/Risk  Factors/Patient Goals Review:    Core Components/Risk Factors/Patient Goals at Discharge (Final Review):    ITP Comments: ITP Comments    Row Name 02/21/18 1354 02/23/18 0602         ITP Comments  Med Review completed. Initial ITP created. [facility] day review. Continue with ITP unless directed changes per Medical Director   New to program         Comments:

## 2018-03-01 DIAGNOSIS — I214 Non-ST elevation (NSTEMI) myocardial infarction: Secondary | ICD-10-CM | POA: Diagnosis not present

## 2018-03-01 LAB — GLUCOSE, CAPILLARY
GLUCOSE-CAPILLARY: 137 mg/dL — AB (ref 65–99)
GLUCOSE-CAPILLARY: 125 mg/dL — AB (ref 65–99)

## 2018-03-01 NOTE — Progress Notes (Signed)
Daily Session Note  Patient Details  Name: Tywone Bembenek MRN: [id number] Date of Birth: Mar 28, 1961 Referring Provider:     Cardiac Rehab from 02/21/2018 in [facility]  Referring Provider  Isaias Cowman MD      Encounter Date: 03/01/2018  Check In: Session Check In - 03/01/18 0908      Check-In   Location  [facility]-Cardiac & Pulmonary Rehab    Staff Present  Alberteen Sam, MA, RCEP, CCRP, Exercise Physiologist;Amanda Oletta Darter, BA, ACSM CEP, Exercise Physiologist;Susanne Bice, RN, BSN, CCRP    Supervising physician immediately available to respond to emergencies  See telemetry face sheet for immediately available ER MD    Medication changes reported      No    Fall or balance concerns reported     No    Warm-up and Cool-down  Performed on first and last piece of equipment    Resistance Training Performed  Yes    VAD Patient?  No      Pain Assessment   Currently in Pain?  No/denies    Multiple Pain Sites  No          Social History   Tobacco Use  Smoking Status Never Smoker  Smokeless Tobacco Never Used    Goals Met:  Independence with exercise equipment Exercise tolerated well No report of cardiac concerns or symptoms Strength training completed today  Goals Unmet:  Not Applicable  Comments: Reviewed home exercise with pt today.  Pt plans to go to [facility] for exercise.  Reviewed THR, pulse, RPE, sign and symptoms, NTG use, and when to call 911 or MD.  Also discussed weather considerations and indoor options.  Pt voiced understanding.    Dr. Emily Filbert is Medical Director for [place] and [facility].

## 2018-03-03 ENCOUNTER — Encounter: Payer: Federal, State, Local not specified - PPO | Attending: Cardiology

## 2018-03-03 DIAGNOSIS — I214 Non-ST elevation (NSTEMI) myocardial infarction: Secondary | ICD-10-CM | POA: Insufficient documentation

## 2018-03-03 LAB — GLUCOSE, CAPILLARY
Glucose-Capillary: 135 mg/dL — ABNORMAL HIGH (ref 65–99)
Glucose-Capillary: 121 mg/dL — ABNORMAL HIGH (ref 65–99)

## 2018-03-03 NOTE — Progress Notes (Signed)
Daily Session Note  Patient Details  Name: Jeffrey Knight MRN: [id number] Date of Birth: Jun 02, 1961 Referring Provider:     Cardiac Rehab from 02/21/2018 in [facility] Card[facility]  Referring Provider  Isaias Cowman MD      Encounter Date: 03/03/2018  Check In: Session Check In - 03/03/18 0832      Check-In   Location  [facility]-Cardiac & Pulmonary Rehab    Staff Present  Alberteen Sam, MA, RCEP, CCRP, Exercise Physiologist;Reyes Aldaco Oletta Darter, BA, ACSM CEP, Exercise Physiologist;Carroll Enterkin, RN, BSN    Supervising physician immediately available to respond to emergencies  See telemetry face sheet for immediately available ER MD    Medication changes reported      No    Fall or balance concerns reported     No    Warm-up and Cool-down  Performed on first and last piece of equipment    Resistance Training Performed  Yes    VAD Patient?  No      Pain Assessment   Currently in Pain?  No/denies    Multiple Pain Sites  No          Social History   Tobacco Use  Smoking Status Never Smoker  Smokeless Tobacco Never Used    Goals Met:  Independence with exercise equipment Exercise tolerated well No report of cardiac concerns or symptoms Strength training completed today  Goals Unmet:  Not Applicable  Comments: Pt able to follow exercise prescription today without complaint.  Will continue to monitor for progression.    Dr. Emily Filbert is Medical Director for [place] and [facility].

## 2018-03-15 ENCOUNTER — Encounter: Payer: Self-pay | Admitting: *Deleted

## 2018-03-15 ENCOUNTER — Telehealth: Payer: Self-pay | Admitting: *Deleted

## 2018-03-15 DIAGNOSIS — I214 Non-ST elevation (NSTEMI) myocardial infarction: Secondary | ICD-10-CM

## 2018-03-15 NOTE — Telephone Encounter (Signed)
Called to check on status of return to program.  Pt has been out since 03/03/18.  Left message.

## 2018-03-23 ENCOUNTER — Encounter: Payer: Self-pay | Admitting: *Deleted

## 2018-03-23 DIAGNOSIS — I214 Non-ST elevation (NSTEMI) myocardial infarction: Secondary | ICD-10-CM

## 2018-03-23 NOTE — Progress Notes (Signed)
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 [facility]  Date  02/21/18  Educator  [facility]  Instruction Review Code  1- Verbalizes Understanding      Know Your Numbers and Risk Factors: -Group verbal and written instruction about important numbers in your health.  Discussion of what are risk factors and how they play a role in the disease process.  Review of Cholesterol, Blood Pressure, Diabetes, and BMI and the role they play in your overall health.   Sleep Hygiene: -Provides group verbal and written instruction about how sleep can affect your health.  Define sleep hygiene, discuss sleep cycles and impact of sleep habits. Review good sleep hygiene tips.    Other: -Provides group and verbal instruction on various topics (see comments)   Knowledge Questionnaire Score: Knowledge Questionnaire Score - 02/21/18 1408      Knowledge Questionnaire Score   Pre Score  23/28  correct answers reviewed with Mr. Forgette       Core Components/Risk Factors/Patient  Goals at Admission: Personal Goals and Risk Factors at Admission - 02/21/18 1357      Core Components/Risk Factors/Patient Goals on Admission    Weight Management  Yes;Obesity;Weight Loss    Intervention  Weight Management: Develop a combined nutrition and exercise program designed to reach desired caloric intake, while maintaining appropriate intake of nutrient and fiber, sodium and fats, and appropriate energy expenditure required for the weight goal.;Weight Management: Provide education and appropriate resources to help participant work on and attain dietary goals.;Weight Management/Obesity: Establish reasonable short term and long term weight goals.;Obesity: Provide education and appropriate resources to help participant work on and attain dietary goals.    Admit Weight  288 lb (130.6 kg)    Goal Weight: Short Term  283 lb (128.4 kg)    Goal Weight: Long Term  240 lb (108.9 kg)    Expected Outcomes  Long Term: Adherence to nutrition and physical activity/exercise program aimed toward attainment of established weight goal;Short Term: Continue to assess and modify interventions until short term weight is achieved;Weight Loss: Understanding of general recommendations for a balanced deficit meal plan, which promotes 1-2 lb weight loss per week and includes a negative energy balance of [phone] kcal/d;Understanding recommendations for meals to include 15-35% energy as protein, 25-35% energy from fat, 35-60% energy from carbohydrates, less than 264m of dietary cholesterol, 20-35 gm of total fiber daily;Understanding of distribution of calorie intake throughout the day with the consumption of 4-5 meals/snacks    Diabetes  Yes    Intervention  Provide education about signs/symptoms and action to take for hypo/hyperglycemia.;Provide education about proper nutrition, including hydration, and aerobic/resistive exercise  prescription along with prescribed medications to achieve blood glucose in normal ranges: Fasting glucose 65-99 mg/dL    Expected Outcomes  Short Term: Participant verbalizes understanding of the signs/symptoms and immediate care of hyper/hypoglycemia, proper foot care and importance of medication, aerobic/resistive exercise and nutrition plan for blood glucose control.;Long Term: Attainment of HbA1C < 7%.    Hypertension  Yes    Intervention  Provide education on lifestyle modifcations including regular physical activity/exercise, weight management, moderate sodium restriction and increased consumption of fresh fruit, vegetables, and low fat dairy, alcohol moderation, and smoking cessation.;Monitor prescription use compliance.    Expected Outcomes  Short Term: Continued assessment and intervention until BP is < 140/934mHG in hypertensive participants. < 130/8025mG in hypertensive participants with diabetes, heart failure or chronic kidney disease.;Long Term: Maintenance of blood pressure at goal levels.    Lipids  Yes    Intervention  Provide education and support for participant on nutrition & aerobic/resistive exercise along with prescribed medications to achieve LDL <36m23mDL >40mg18m Expected Outcomes  Short Term: Participant states understanding of desired cholesterol values and is compliant with medications prescribed. Participant is following exercise prescription and nutrition guidelines.;Long Term: Cholesterol controlled with medications as prescribed, with individualized exercise RX and with personalized nutrition plan. Value goals: LDL < 36mg,36m > 40 mg.       Core Components/Risk Factors/Patient Goals Review:    Core Components/Risk Factors/Patient Goals at Discharge (Final Review):    ITP Comments: ITP Comments    Row Name 02/21/18 1354 02/23/18 0602 03/15/18 1129 03/23/18 0557     ITP Comments  Med Review completed. Initial ITP created. Diangosis can be found in [facility] 4/[facility]9   30 day review. Continue with ITP unless directed changes per Medical Director   New to program  Called to check on status of  return to program.  Pt has been out since 03/03/18.  Left message.   30 day review. Continue with ITP unless directed changes per Medical Director    Last visit 03/03/2018       Comments:

## 2018-03-29 ENCOUNTER — Telehealth: Payer: Self-pay | Admitting: *Deleted

## 2018-03-29 ENCOUNTER — Encounter: Payer: Self-pay | Admitting: *Deleted

## 2018-03-29 DIAGNOSIS — I214 Non-ST elevation (NSTEMI) myocardial infarction: Secondary | ICD-10-CM

## 2018-03-29 NOTE — Telephone Encounter (Signed)
Called to check on patient.  Out since 03/03/18.  Left message.

## 2018-04-05 ENCOUNTER — Encounter: Payer: Federal, State, Local not specified - PPO | Attending: Cardiology

## 2018-04-05 DIAGNOSIS — I214 Non-ST elevation (NSTEMI) myocardial infarction: Secondary | ICD-10-CM | POA: Insufficient documentation

## 2018-04-12 ENCOUNTER — Encounter: Payer: Self-pay | Admitting: *Deleted

## 2018-04-12 DIAGNOSIS — I214 Non-ST elevation (NSTEMI) myocardial infarction: Secondary | ICD-10-CM

## 2018-04-12 NOTE — Progress Notes (Signed)
Discharge Progress Report  Patient Details  Name: Jeffrey Knight MRN: [id number] Date of Birth: 1960/12/24 Referring Provider:     Cardiac Rehab from 02/21/2018 in [facility] Cardiac and Pulmonary Rehab  Referring Provider  Marcina Millard MD       Number of Visits: 3/36  Reason for Discharge:  Early Exit:  Lack of attendance  Smoking History:  Social History   Tobacco Use  Smoking Status Never Smoker  Smokeless Tobacco Never Used    Diagnosis:  NSTEMI (non-ST elevated myocardial infarction) (HCC)  ADL [facility]:   Initial Exercise Prescription: Initial Exercise Prescription - 02/21/18 1400      Date of Initial Exercise RX and Referring Provider   Date  02/21/18    Referring Provider  Marcina Millard MD      Treadmill   MPH  3    Grade  1    Minutes  15    METs  3.71      Recumbant Bike   Level  3    RPM  50    Watts  85    Minutes  15    METs  3.5      T5 Nustep   Level  3    SPM  80    Minutes  15    METs  3.5      Prescription Details   Frequency (times per week)  2    Duration  Progress to 30 minutes of continuous aerobic without signs/symptoms of physical distress      Intensity   THRR 40-80% of Max Heartrate  108-147    Ratings of Perceived Exertion  11-13    Perceived Dyspnea  0-4      Progression   Progression  Continue to progress workloads to maintain intensity without signs/symptoms of physical distress.      Resistance Training   Training Prescription  Yes    Weight  4 lbs    Reps  10-15       Discharge Exercise Prescription (Final Exercise Prescription Changes): Exercise Prescription Changes - 03/15/18 1100      Response to Exercise   Blood Pressure (Admit)  132/84    Blood Pressure (Exercise)  144/86    Blood Pressure (Exit)  140/80    Heart Rate (Admit)  67 bpm    Heart Rate (Exercise)  110 bpm    Heart Rate (Exit)  91 bpm    Rating of Perceived Exertion (Exercise)  12    Symptoms  none    Duration  Continue with 30  min of aerobic exercise without signs/symptoms of physical distress.    Intensity  THRR unchanged      Progression   Progression  Continue to progress workloads to maintain intensity without signs/symptoms of physical distress.    Average METs  3.6      Resistance Training   Training Prescription  Yes    Weight  4 lbs    Reps  10-15      Interval Training   Interval Training  No      REL-XR   Level  9    Minutes  15    METs  3.5      T5 Nustep   Level  3    Minutes  15    METs  3.7      Home Exercise Plan   Plans to continue exercise at  [employer] (comment) [employer]    Frequency  Add 2 additional  days to program exercise sessions.    Initial Home Exercises Provided  03/01/18       Functional Capacity: 6 Minute Walk    Row Name 02/21/18 1426         6 Minute Walk   Phase  Initial     Distance  1602 feet     Walk Time  6 minutes     # of Rest Breaks  0     MPH  3.03     METS  4.03     RPE  11     VO2 Peak  14.11     Symptoms  Yes (comment)     Comments  knees and hip pain 2-3/10     Resting HR  70 bpm     Resting BP  122/66     Resting Oxygen Saturation   99 %     Exercise Oxygen Saturation  during 6 min walk  98 %     Max Ex. HR  110 bpm     Max Ex. BP  154/84     2 Minute Post BP  136/70        Psychological, QOL, Others - Outcomes: PHQ 2/9: Depression screen PHQ 2/9 02/21/2018  Decreased Interest 0  Down, Depressed, Hopeless 2  PHQ - 2 Score 2  Altered sleeping 2  Tired, decreased energy 2  Change in appetite 1  Feeling bad or failure about yourself  2  Trouble concentrating 0  Moving slowly or fidgety/restless 0  Suicidal thoughts 0  PHQ-9 Score 9  Difficult doing work/chores Somewhat difficult    Quality of Life: Quality of Life - 02/21/18 1407      Quality of Life Scores   Health/Function Pre  16 %    Socioeconomic Pre  19.96 %    Psych/Spiritual Pre  19.71 %    Family Pre  13.2 %    GLOBAL Pre  17.24 %        Personal Goals: Goals established at orientation with interventions provided to work toward goal. Personal Goals and Risk Factors at Admission - 02/21/18 1357      Core Components/Risk Factors/Patient Goals on Admission    Weight Management  Yes;Obesity;Weight Loss    Intervention  Weight Management: Develop a combined nutrition and exercise program designed to reach desired caloric intake, while maintaining appropriate intake of nutrient and fiber, sodium and fats, and appropriate energy expenditure required for the weight goal.;Weight Management: Provide education and appropriate resources to help participant work on and attain dietary goals.;Weight Management/Obesity: Establish reasonable short term and long term weight goals.;Obesity: Provide education and appropriate resources to help participant work on and attain dietary goals.    Admit Weight  288 lb (130.6 kg)    Goal Weight: Short Term  283 lb (128.4 kg)    Goal Weight: Long Term  240 lb (108.9 kg)    Expected Outcomes  Long Term: Adherence to nutrition and physical activity/exercise program aimed toward attainment of established weight goal;Short Term: Continue to assess and modify interventions until short term weight is achieved;Weight Loss: Understanding of general recommendations for a balanced deficit meal plan, which promotes 1-2 lb weight loss per week and includes a negative energy balance of [phone] kcal/d;Understanding recommendations for meals to include 15-35% energy as protein, 25-35% energy from fat, 35-60% energy from carbohydrates, less than 200mg  of dietary cholesterol, 20-35 gm of total fiber daily;Understanding of distribution of calorie intake throughout the day with the  consumption of 4-5 meals/snacks    Diabetes  Yes    Intervention  Provide education about signs/symptoms and action to take for hypo/hyperglycemia.;Provide education about proper nutrition, including hydration, and aerobic/resistive exercise  prescription along with prescribed medications to achieve blood glucose in normal ranges: Fasting glucose 65-99 mg/dL    Expected Outcomes  Short Term: Participant verbalizes understanding of the signs/symptoms and immediate care of hyper/hypoglycemia, proper foot care and importance of medication, aerobic/resistive exercise and nutrition plan for blood glucose control.;Long Term: Attainment of HbA1C < 7%.    Hypertension  Yes    Intervention  Provide education on lifestyle modifcations including regular physical activity/exercise, weight management, moderate sodium restriction and increased consumption of fresh fruit, vegetables, and low fat dairy, alcohol moderation, and smoking cessation.;Monitor prescription use compliance.    Expected Outcomes  Short Term: Continued assessment and intervention until BP is < 140/92mm HG in hypertensive participants. < 130/72mm HG in hypertensive participants with diabetes, heart failure or chronic kidney disease.;Long Term: Maintenance of blood pressure at goal levels.    Lipids  Yes    Intervention  Provide education and support for participant on nutrition & aerobic/resistive exercise along with prescribed medications to achieve LDL 70mg , HDL >40mg .    Expected Outcomes  Short Term: Participant states understanding of desired cholesterol values and is compliant with medications prescribed. Participant is following exercise prescription and nutrition guidelines.;Long Term: Cholesterol controlled with medications as prescribed, with individualized exercise RX and with personalized nutrition plan. Value goals: LDL < 70mg , HDL > 40 mg.        Personal Goals Discharge:   Exercise Goals and Review: Exercise Goals    Row Name 02/21/18 1431             Exercise Goals   Increase Physical Activity  Yes       Intervention  Provide advice, education, support and counseling about physical activity/exercise needs.;Develop an individualized exercise prescription for  aerobic and resistive training based on initial evaluation findings, risk stratification, comorbidities and participant's personal goals.       Expected Outcomes  Short Term: Attend rehab on a regular basis to increase amount of physical activity.;Long Term: Add in home exercise to make exercise part of routine and to increase amount of physical activity.;Long Term: Exercising regularly at least 3-5 days a week.       Increase Strength and Stamina  Yes       Intervention  Provide advice, education, support and counseling about physical activity/exercise needs.;Develop an individualized exercise prescription for aerobic and resistive training based on initial evaluation findings, risk stratification, comorbidities and participant's personal goals.       Expected Outcomes  Short Term: Increase workloads from initial exercise prescription for resistance, speed, and METs.;Short Term: Perform resistance training exercises routinely during rehab and add in resistance training at home;Long Term: Improve cardiorespiratory fitness, muscular endurance and strength as measured by increased METs and functional capacity ( )       Able to understand and use rate of perceived exertion (RPE) scale  Yes       Intervention  Provide education and explanation on how to use RPE scale       Expected Outcomes  Short Term: Able to use RPE daily in rehab to express subjective intensity level;Long Term:  Able to use RPE to guide intensity level when exercising independently       Knowledge and understanding of Target Heart Rate Range (THRR)  Yes  Intervention  Provide education and explanation of THRR including how the numbers were predicted and where they are located for reference       Expected Outcomes  Short Term: Able to state/look up THRR;Short Term: Able to use daily as guideline for intensity in rehab;Long Term: Able to use THRR to govern intensity when exercising independently       Able to check pulse  independently  Yes       Intervention  Provide education and demonstration on how to check pulse in carotid and radial arteries.;Review the importance of being able to check your own pulse for safety during independent exercise       Expected Outcomes  Short Term: Able to explain why pulse checking is important during independent exercise;Long Term: Able to check pulse independently and accurately       Understanding of Exercise Prescription  Yes       Intervention  Provide education, explanation, and written materials on patient's individual exercise prescription       Expected Outcomes  Short Term: Able to explain program exercise prescription;Long Term: Able to explain home exercise prescription to exercise independently          Nutrition & Weight - Outcomes: Pre Biometrics - 02/21/18 1431      Pre Biometrics   Height  6' 2.25" (1.886 m)    Weight  288 lb 1.6 oz (130.7 kg)    Waist Circumference  47.5 inches    Hip Circumference  42.5 inches    Waist to Hip Ratio  1.12 %    BMI (Calculated)  36.74    Single Leg Stand  23.61 seconds        Nutrition: Nutrition Therapy & Goals - 02/21/18 1400      Intervention Plan   Intervention  Prescribe, educate and counsel regarding individualized specific dietary modifications aiming towards targeted core components such as weight, hypertension, lipid management, diabetes, heart failure and other comorbidities.;Nutrition handout(s) given to patient.    Expected Outcomes  Short Term Goal: Understand basic principles of dietary content, such as calories, fat, sodium, cholesterol and nutrients.;Short Term Goal: A plan has been developed with personal nutrition goals set during dietitian appointment.;Long Term Goal: Adherence to prescribed nutrition plan.       Nutrition Discharge: Nutrition Assessments - 02/21/18 1401      MEDFICTS Scores   Pre Score  0       Education Questionnaire Score: Knowledge Questionnaire Score - 02/21/18 1408       Knowledge Questionnaire Score   Pre Score  23/28 correct answers reviewed with Mr. Whisenant       Goals reviewed with patient; copy given to patient.

## 2018-04-12 NOTE — Progress Notes (Signed)
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 Acknowledge that pre and post exercise glucose checks will be done for 3 sessions at entry of program.   Cardiac Rehab from 03/03/2018 in [facility]  Date  02/21/18  Educator  MC  Instruction Review Code  1- Verbalizes Understanding      Know Your Numbers and Risk Factors: -Group verbal and written instruction about important numbers in your health.  Discussion of what are risk factors and how they play a role in the disease process.  Review of Cholesterol, Blood Pressure, Diabetes, and BMI and the role they play in your overall health.   Sleep Hygiene: -Provides group verbal and written instruction about how sleep can affect your health.  Define sleep hygiene, discuss sleep cycles and impact of sleep habits. Review good sleep hygiene tips.    Other: -Provides group and verbal instruction on various topics (see comments)   Knowledge Questionnaire Score: Knowledge Questionnaire Score - 02/21/18 1408       Knowledge Questionnaire Score   Pre Score  23/28 correct answers reviewed with Mr. Ruben         Core Components/Risk Factors/Patient Goals at Admission: Personal Goals and Risk Factors at Admission - 02/21/18 1357      Core Components/Risk Factors/Patient Goals on Admission    Weight Management  Yes;Obesity;Weight Loss    Intervention  Weight Management: Develop a combined nutrition and exercise program designed to reach desired caloric intake, while maintaining appropriate intake of nutrient and fiber, sodium and fats, and appropriate energy expenditure required for the weight goal.;Weight Management: Provide education and appropriate resources to help participant work on and attain dietary goals.;Weight Management/Obesity: Establish reasonable short term and long term weight goals.;Obesity: Provide education and appropriate resources to help participant work on and attain dietary goals.    Admit Weight  288 lb (130.6 kg)    Goal Weight: Short Term  283 lb (128.4 kg)    Goal Weight: Long Term  240 lb (108.9 kg)    Expected Outcomes  Long Term: Adherence to nutrition and physical activity/exercise program aimed toward attainment of established weight goal;Short Term: Continue to assess and modify interventions until short term weight is achieved;Weight Loss: Understanding of general recommendations for a balanced deficit meal plan, which promotes 1-2 lb weight loss per week and includes a negative energy balance of [phone] kcal/d;Understanding recommendations for meals to include 15-35% energy as protein, 25-35% energy from fat, 35-60% energy from carbohydrates, less than 264m of dietary cholesterol, 20-35 gm of total fiber daily;Understanding of distribution of calorie intake throughout the day with the consumption of 4-5 meals/snacks    Diabetes  Yes    Intervention  Provide education about signs/symptoms and action to take for hypo/hyperglycemia.;Provide education about proper nutrition,  including hydration, and aerobic/resistive exercise prescription along with prescribed medications to achieve blood glucose in normal ranges: Fasting glucose 65-99 mg/dL    Expected Outcomes  Short Term: Participant verbalizes understanding of the signs/symptoms and immediate care of hyper/hypoglycemia, proper foot care and importance of medication, aerobic/resistive exercise and nutrition plan for blood glucose control.;Long Term: Attainment of HbA1C < 7%.    Hypertension  Yes    Intervention  Provide education on lifestyle modifcations including regular physical activity/exercise, weight management, moderate sodium restriction and increased consumption of fresh fruit, vegetables, and low fat dairy, alcohol moderation, and smoking cessation.;Monitor prescription use compliance.    Expected Outcomes  Short Term: Continued assessment and intervention until BP is < 140/929mHG in hypertensive participants. < 130/8038mG in hypertensive participants with diabetes, heart failure or chronic kidney disease.;Long Term: Maintenance of blood pressure at goal levels.    Lipids  Yes    Intervention  Provide education and support for participant on nutrition & aerobic/resistive exercise along with prescribed medications to achieve LDL <37m24mDL >40mg68m Expected Outcomes  Short Term: Participant states understanding of desired cholesterol values and is compliant with medications prescribed. Participant is following exercise prescription and nutrition guidelines.;Long Term: Cholesterol controlled with medications as prescribed, with individualized exercise RX and with personalized nutrition plan. Value goals: LDL < 37mg,34m > 40 mg.       Core Components/Risk Factors/Patient Goals Review:    Core Components/Risk Factors/Patient Goals at Discharge (Final Review):    ITP Comments: ITP Comments    Row Name 02/21/18 1354 02/23/18 0602 03/15/18 1129 03/23/18 0557 03/29/18 1432   ITP Comments  Med Review  completed. Initial ITP created. Diangosis can be found in [facility] 4[facility]  30 day review. Continue with ITP unless directed changes per Medical Director   New to program  Called to check  on status of return to program.  Pt has been out since 03/03/18.  Left message.   30 day review. Continue with ITP unless directed changes per Medical Director    Last visit 03/03/2018  Called to check on patient.  Out since 03/03/18.  Left message.    [place] Name 04/12/18 1031           ITP Comments  Sent Iona Beard a discharge letter.  He has not attended since 03/03/18.  Sent Discharge ITP and Discharge summary.          Comments: Discharge ITP

## 2019-01-19 ENCOUNTER — Other Ambulatory Visit: Payer: Self-pay

## 2019-01-19 ENCOUNTER — Emergency Department: Payer: Federal, State, Local not specified - PPO

## 2019-01-19 ENCOUNTER — Encounter: Payer: Self-pay | Admitting: Emergency Medicine

## 2019-01-19 ENCOUNTER — Emergency Department
Admission: EM | Admit: 2019-01-19 | Discharge: 2019-01-19 | Disposition: A | Discharge status: Home / Self Care | Payer: Federal, State, Local not specified - PPO | Attending: Emergency Medicine | Admitting: Emergency Medicine

## 2019-01-19 DIAGNOSIS — Z955 Presence of coronary angioplasty implant and graft: Secondary | ICD-10-CM | POA: Insufficient documentation

## 2019-01-19 DIAGNOSIS — R339 Retention of urine, unspecified: Secondary | ICD-10-CM | POA: Diagnosis not present

## 2019-01-19 DIAGNOSIS — R1032 Left lower quadrant pain: Secondary | ICD-10-CM | POA: Diagnosis present

## 2019-01-19 DIAGNOSIS — N400 Enlarged prostate without lower urinary tract symptoms: Secondary | ICD-10-CM | POA: Diagnosis not present

## 2019-01-19 DIAGNOSIS — E119 Type 2 diabetes mellitus without complications: Secondary | ICD-10-CM | POA: Diagnosis not present

## 2019-01-19 DIAGNOSIS — I10 Essential (primary) hypertension: Secondary | ICD-10-CM | POA: Insufficient documentation

## 2019-01-19 DIAGNOSIS — I252 Old myocardial infarction: Secondary | ICD-10-CM | POA: Insufficient documentation

## 2019-01-19 LAB — BASIC METABOLIC PANEL
Anion gap: 9 (ref 5–15)
BUN: 8 mg/dL (ref 6–20)
CO2: 25 mmol/L (ref 22–32)
Calcium: 9.2 mg/dL (ref 8.9–10.3)
Chloride: 105 mmol/L (ref 98–111)
Creatinine, Ser: 0.92 mg/dL (ref 0.61–1.24)
GFR calc Af Amer: >60 mL/min (ref >60)
GFR calc non Af Amer: >60 mL/min (ref >60)
Glucose, Bld: 172 mg/dL — ABNORMAL HIGH (ref 70–99)
Potassium: 3.8 mmol/L (ref 3.5–5.1)
Sodium: 139 mmol/L (ref 135–145)

## 2019-01-19 LAB — CBC
HCT: 43.5 % (ref 39.0–52.0)
Hemoglobin: 14.3 g/dL (ref 13.0–17.0)
MCH: 28.3 pg (ref 26.0–34.0)
MCHC: 32.9 g/dL (ref 30.0–36.0)
MCV: 86.1 fL (ref 80.0–100.0)
Platelets: 271 10*3/uL (ref 150–400)
RBC: 5.05 MIL/uL (ref 4.22–5.81)
RDW: 12.3 % (ref 11.5–15.5)
WBC: 10 10*3/uL (ref 4.0–10.5)
nRBC: 0 % (ref 0.0–0.2)

## 2019-01-19 LAB — URINALYSIS, COMPLETE (UACMP) WITH MICROSCOPIC
BILIRUBIN URINE: NEGATIVE
Bacteria, UA: NONE SEEN
Glucose, UA: 50 mg/dL — AB
KETONES UR: NEGATIVE mg/dL
LEUKOCYTE UA: NEGATIVE
Nitrite: NEGATIVE
PH: 6 (ref 5.0–8.0)
Protein, ur: NEGATIVE mg/dL
SPECIFIC GRAVITY, URINE: 1.011 (ref 1.005–1.030)
SQUAMOUS EPITHELIAL / LPF: NONE SEEN (ref 0–5)

## 2019-01-19 NOTE — ED Notes (Signed)
Pt changed into a leg bag. Pt verbalizes understanding on how to operate leg beg.

## 2019-01-19 NOTE — ED Triage Notes (Signed)
Pt here with c/o feeling a full bladder, states he has bph but has never been "blocked" like this. Had a full stream last night but nothing but "dribbles" today. Diaphoretic in triage. Left flank pain, denies hx of kidney stones.

## 2019-01-19 NOTE — ED Notes (Signed)
Pt taken to CT.

## 2019-01-19 NOTE — ED Notes (Signed)
Bladder scanned in triage <999.

## 2019-01-19 NOTE — ED Provider Notes (Signed)
[facility] Emergency Department Provider Note  ____________________________________________  Time seen: Approximately 12:52 PM  I have reviewed the triage vital signs and the nursing notes.   HISTORY  Chief Complaint Flank Pain and Urinary Retention    HPI Jeffrey Knight is a 58 y.o. male history of BPH, obesity, HTN and CAD, DM 2, presenting for urinary retention.  The patient reports he has had an enlarged prostate and occasionally has urinary "dribbling" but since last night has been unable to void at all.  He has had some left flank pain without any nausea vomiting, fevers or chills.  He reports significant pressure in the lower abdomen.  Past Medical History:  Diagnosis Date  . Hypertension     Patient Active Problem List   Diagnosis Date Noted  . NSTEMI (non-ST elevated myocardial infarction) (HCC) 02/08/2018  . Type 2 diabetes mellitus without complication (HCC) 10/28/2016  . BPH (benign prostatic hyperplasia) 10/24/2016  . Asthma 09/28/2016  . Hypertension, essential 09/28/2016  . IBS (irritable bowel syndrome) 09/28/2016  . Lumbar stenosis 09/28/2016  . Obstructive sleep apnea 09/28/2016    Past Surgical History:  Procedure Laterality Date  . CORONARY STENT INTERVENTION N/A 02/09/2018   Procedure: CORONARY STENT INTERVENTION;  Surgeon: Marcina Millard, MD;  Location: [facility] INV[facility]IVE CV LAB;  Service: Cardiovascular;  Laterality: N/A;  . KNEE SURGERY Left   . LEFT HEART CATH AND CORONARY ANGIOGRAPHY N/A 02/09/2018   Procedure: LEFT HEART CATH AND CORONARY ANGIOGRAPHY;  Surgeon: Marcina Millard, MD;  Location: [facility] CV [facility];  Service: Cardiovascular;  Laterality: N/A;    Current Outpatient Rx  . Order #: [id number] Class: OTC  . Order #: [id number] Class: Print  . Order #: [id number] Class: Print  . Order #: [id number] Class: Print  . Order #: [id number] Class: Print  . Order #: [id number] Class: Print  . Order #: [id number] Class:  Print  . Order #: [id number] Class: Print    Allergies Codeine  Family History  Problem Relation Age of Onset  . CAD Mother   . CAD Father     Social History Social History   Tobacco Use  . Smoking status: Never Smoker  . Smokeless tobacco: Never Used  Substance Use Topics  . Alcohol use: Not Currently  . Drug use: Not on file    Review of Systems Constitutional: No fever/chills. Eyes: No visual changes. ENT: No sore throat. No congestion or rhinorrhea. Cardiovascular: Denies chest pain. Denies palpitations. Respiratory: Denies shortness of breath.  No cough. Gastrointestinal: Positive lower abdominal distention and pain.  No nausea, no vomiting.  No diarrhea.  No constipation. Genitourinary: Positive urinary incontinence.  Positive left flank pain. Musculoskeletal: Negative for back pain. Skin: Negative for rash. Neurological: Negative for headaches. No focal numbness, tingling or weakness.     ____________________________________________   PHYSICAL EXAM:  VITAL SIGNS: ED Triage Vitals  Enc Vitals Group     BP 01/19/19 1212 (!) 211/97     Pulse Rate 01/19/19 1212 85     Resp 01/19/19 1212 18     Temp 01/19/19 1212 98.4 F (36.9 C)     Temp Source 01/19/19 1212 Oral     SpO2 01/19/19 1212 99 %     Weight 01/19/19 1215 295 lb (133.8 kg)     Height 01/19/19 1215 6\' 2"  (1.88 m)     Head Circumference --      Peak Flow --      Pain Score 01/19/19 1214 6     Pain Loc --  Pain Edu? --      Excl. in GC? --     Constitutional: Alert and oriented.  Answers questions appropriately.  Uncomfortable appearing. Eyes: Conjunctivae are normal.  EOMI. No scleral icterus. Head: Atraumatic. Nose: No congestion/rhinnorhea. Mouth/Throat: Mucous membranes are moist.  Neck: No stridor.  Supple.  No meningismus. Cardiovascular: Normal rate Respiratory: Normal respiratory effort.   Gastrointestinal: Soft.  The abdomen is tender to palpation in the lower abdomen and the  patient has a palpable bladder edge 4 cm above the umbilicus.  No guarding or rebound.  No peritoneal signs. Genitourinary: Normal-appearing circumcised male genitalia. Musculoskeletal: No LE edema.  Neurologic:  A&Ox3.  Speech is clear.  Face and smile are symmetric.  EOMI.  Moves all extremities well. Skin:  Skin is warm, dry and intact. No rash noted. Psychiatric: Mood and affect are normal. Speech and behavior are normal.  Normal judgement.  ____________________________________________   LABS (all labs ordered are listed, but only abnormal results are displayed)  Labs Reviewed  URINALYSIS, COMPLETE (UACMP) WITH MICROSCOPIC  CBC  BASIC METABOLIC PANEL   ____________________________________________  EKG  Not indicated ____________________________________________  RADIOLOGY  No results found.  ____________________________________________   PROCEDURES  Procedure(s) performed: None  Procedures  Critical Care performed: No ____________________________________________   INITIAL IMPRESSION / ASSESSMENT AND PLAN / ED COURSE  Pertinent labs & imaging results that were available during my care of the patient were reviewed by me and considered in my medical decision making (see chart for details).  58 y.o. male with a history of BPH presenting with urinary retention since last night.  The patient reports he had a urinary infection several years ago but this is not common for him.  We will evaluate for UTI, renal stone, and check his renal function today.  A Foley catheter is being placed at this time and I will monitor his blood pressure once he has been decompressed.  Plan reevaluation for final disposition.  ____________________________________________  FINAL CLINICAL IMPRESSION(S) / ED DIAGNOSES  Final diagnoses:  None         NEW MEDICATIONS STARTED DURING THIS VISIT:  New Prescriptions   No medications on file      Rockne Menghini, MD 01/19/19  1255

## 2019-01-19 NOTE — Discharge Instructions (Addendum)
Catheter will stay in place until you are cleared by the urologist to have it removed.  Today, you have no evidence of infection in your urine, but you do need to monitor for symptoms of pain in your belly, fever, nausea or vomiting, or changes in the color or consistency of the urine coming out of your catheter.  Please return to the emergency department for any concerns.

## 2019-01-19 NOTE — ED Triage Notes (Signed)
Says unable to urinate normally since yesterday.  Says just dribbles and feels like he is full.

## 2019-01-20 NOTE — Progress Notes (Incomplete)
01/26/2019 11:09 AM   Jeffrey Knight 15-Jul-1961 [id number]  Referring provider: Center, [facility] [address] [place], [place] [address]  No chief complaint on file.   HPI: Master Touchet is a 58 y.o. male African American who presents today to establish urological care and follow up subsequent an emergency department visit for urinary retention secondary to BPH.  Patient reported to the emergency department on 01/19/2019 complaining of being unable to void; he admitted at that time that he had an enlarged prostate and had occasional urinary "dribbling."  He reported some left flank pain and significant pressure in the lower abdomen, but denied nausea, vomiting, fevers or chills.  Emergency department placed a catheter, and proceeded to evaluate for UTI, rental stone, and renal function.  ***  PMH: Past Medical History:  Diagnosis Date   Hypertension     Surgical History: Past Surgical History:  Procedure Laterality Date   CORONARY STENT INTERVENTION N/A 02/09/2018   Procedure: CORONARY STENT INTERVENTION;  Surgeon: Isaias Cowman, MD;  Location: [place] CV LAB;  Service: Cardiovascular;  Laterality: N/A;   KNEE SURGERY Left    LEFT HEART CATH AND CORONARY ANGIOGRAPHY N/A 02/09/2018   Procedure: LEFT HEART CATH AND CORONARY ANGIOGRAPHY;  Surgeon: Isaias Cowman, MD;  Location: [place] CV LAB;  Service: Cardiovascular;  Laterality: N/A;    Home Medications:  Allergies as of 01/26/2019      Reactions   Codeine Nausea And Vomiting      Medication List       Accurate as of January 20, 2019 11:09 AM. Always use your most recent med list.        aspirin 81 MG chewable tablet Chew 1 tablet (81 mg total) by mouth daily.   atorvastatin 40 MG tablet Commonly known as:  LIPITOR Take 1 tablet (40 mg total) by mouth daily at 6 PM.   blood glucose meter kit and supplies Kit Dispense based on patient and insurance preference. Use up to four times  daily as directed. (FOR ICD-9 250.00, 250.01).   clopidogrel 75 MG tablet Commonly known as:  PLAVIX Take 1 tablet (75 mg total) by mouth daily with breakfast.   dicyclomine 20 MG tablet Commonly known as:  BENTYL Take 20 mg by mouth 3 (three) times daily.   finasteride 5 MG tablet Commonly known as:  PROSCAR Take 5 mg by mouth daily.   losartan 100 MG tablet Commonly known as:  COZAAR Take 100 mg by mouth daily.   losartan 25 MG tablet Commonly known as:  COZAAR Take 1 tablet (25 mg total) by mouth daily.   meloxicam 7.5 MG tablet Commonly known as:  MOBIC Take 7.5 mg by mouth daily.   metFORMIN 500 MG tablet Commonly known as:  Glucophage Take 1 tablet (500 mg total) by mouth 2 (two) times daily with a meal.   metoprolol tartrate 50 MG tablet Commonly known as:  LOPRESSOR Take 1 tablet (50 mg total) by mouth 2 (two) times daily.   nitroGLYCERIN 0.4 MG SL tablet Commonly known as:  NITROSTAT Place 1 tablet (0.4 mg total) under the tongue every 5 (five) minutes as needed for chest pain.   pioglitazone 15 MG tablet Commonly known as:  ACTOS Take 15 mg by mouth daily.   tamsulosin 0.4 MG Caps capsule Commonly known as:  FLOMAX Take 0.4 mg by mouth daily.       Allergies:  Allergies  Allergen Reactions   Codeine Nausea And Vomiting  Family History: Family History  Problem Relation Age of Onset   CAD Mother    CAD Father     Social History:  reports that he has never smoked. He has never used smokeless tobacco. He reports previous alcohol use. No history on file for drug.  ROS:                                        Physical Exam: There were no vitals taken for this visit.  {Insert appropriate PE block}  Laboratory Data: Lab Results  Component Value Date   WBC 10.0 01/19/2019   HGB 14.3 01/19/2019   HCT 43.5 01/19/2019   MCV 86.1 01/19/2019   PLT 271 01/19/2019    Lab Results  Component Value Date   CREATININE  0.92 01/19/2019    No results found for: PSA  No results found for: TESTOSTERONE  Lab Results  Component Value Date   HGBA1C 8.1 (H) 02/08/2018    Lab Results  Component Value Date   TSH 2.629 02/08/2018       Component Value Date/Time   CHOL 180 02/09/2018 0431   HDL 36 (L) 02/09/2018 0431   CHOLHDL 5.0 02/09/2018 0431   VLDL 38 02/09/2018 0431   LDLCALC 106 (H) 02/09/2018 0431    No results found for: AST No results found for: ALT No components found for: ALKALINEPHOPHATASE No components found for: BILIRUBINTOTAL  No results found for: ESTRADIOL  Urinalysis    Component Value Date/Time   COLORURINE STRAW (A) 01/19/2019 1248   APPEARANCEUR CLEAR (A) 01/19/2019 1248   LABSPEC 1.011 01/19/2019 1248   PHURINE 6.0 01/19/2019 1248   GLUCOSEU 50 (A) 01/19/2019 1248   HGBUR LARGE (A) 01/19/2019 1248   BILIRUBINUR NEGATIVE 01/19/2019 1248   KETONESUR NEGATIVE 01/19/2019 1248   PROTEINUR NEGATIVE 01/19/2019 1248   NITRITE NEGATIVE 01/19/2019 1248   LEUKOCYTESUR NEGATIVE 01/19/2019 1248    I have reviewed the labs.  Pertinent Imaging: *** No results found for this or any previous visit. No results found for this or any previous visit. No results found for this or any previous visit. No results found for this or any previous visit. No results found for this or any previous visit. No results found for this or any previous visit. No results found for this or any previous visit. Results for orders placed during the hospital encounter of 01/19/19  CT RENAL STONE STUDY   Narrative CLINICAL DATA:  Onset difficulty urinating today.  EXAM: CT ABDOMEN AND PELVIS WITHOUT CONTRAST  TECHNIQUE: Multidetector CT imaging of the abdomen and pelvis was performed following the standard protocol without IV contrast.  COMPARISON:  None.  FINDINGS: Lower chest: Lung bases are clear. Heart size is normal. No pleural or pericardial effusion.  Hepatobiliary: No focal  lesion. The liver is low attenuating consistent with fatty infiltration. Gallbladder and biliary tree appear normal.  Pancreas: Unremarkable. No pancreatic ductal dilatation or surrounding inflammatory changes.  Spleen: Normal in size without focal abnormality.  Adrenals/Urinary Tract: The adrenal glands appear normal. No urinary tract stones are identified. Mild fullness of the intrarenal collecting systems is present bilaterally. The bladder is distended. Marked prostatomegaly is seen. No focal prostate lesion.  Stomach/Bowel: There is some diverticulosis of the descending and sigmoid colon without diverticulitis. The colon otherwise appears normal. The stomach, small bowel and appendix appear normal.  Vascular/Lymphatic: No significant vascular findings  are present. No enlarged abdominal or pelvic lymph nodes.  Reproductive: Prostatomegaly is noted above.  Other: Small fat containing umbilical and supraumbilical hernia noted.  Musculoskeletal: No acute or focal lesion. There is some degenerative change about the hips. Degenerative disc disease L4-5 also noted.  IMPRESSION: Distention of the urinary bladder is most likely due to marked prostatomegaly.  Negative for urinary tract stone.  Fatty infiltration of the liver.  Small fat containing umbilical and supraumbilical hernia.   Electronically Signed   By: Inge Rise M.D.   On: 01/19/2019 12:54     I have independently reviewed the films.  Assessment & Plan:    1. BPH with LUTS - ***  No follow-ups on file.  Zara Council, PA-C  [facility] [address] [place], [place] [address] [phone]  I, Adele Schilder, am acting as a Education administrator for [employer], PA-C   {Add Scribe Attestation Statement}

## 2019-01-24 ENCOUNTER — Other Ambulatory Visit: Payer: Self-pay

## 2019-01-24 ENCOUNTER — Encounter: Payer: Self-pay | Admitting: Emergency Medicine

## 2019-01-24 ENCOUNTER — Emergency Department
Admission: EM | Admit: 2019-01-24 | Discharge: 2019-01-24 | Disposition: A | Discharge status: Home / Self Care | Payer: Federal, State, Local not specified - PPO | Attending: Emergency Medicine | Admitting: Emergency Medicine

## 2019-01-24 DIAGNOSIS — I10 Essential (primary) hypertension: Secondary | ICD-10-CM | POA: Insufficient documentation

## 2019-01-24 DIAGNOSIS — T839XXA Unspecified complication of genitourinary prosthetic device, implant and graft, initial encounter: Secondary | ICD-10-CM | POA: Diagnosis not present

## 2019-01-24 DIAGNOSIS — Y658 Other specified misadventures during surgical and medical care: Secondary | ICD-10-CM | POA: Insufficient documentation

## 2019-01-24 DIAGNOSIS — E119 Type 2 diabetes mellitus without complications: Secondary | ICD-10-CM | POA: Diagnosis not present

## 2019-01-24 DIAGNOSIS — Z7982 Long term (current) use of aspirin: Secondary | ICD-10-CM | POA: Diagnosis not present

## 2019-01-24 DIAGNOSIS — J45909 Unspecified asthma, uncomplicated: Secondary | ICD-10-CM | POA: Insufficient documentation

## 2019-01-24 NOTE — ED Notes (Signed)
Kinked cath tube at this time , will continue to monitor when bladder is full and see if pt is able to void without cath

## 2019-01-24 NOTE — ED Notes (Signed)
Says he was supposed to follow up with urology at [facility], but they cancelled appointmtne and are not making appointments right now.

## 2019-01-24 NOTE — ED Notes (Signed)
Indwelling urinary catheter removed. Catheter was intact but bloody. The pt is attempting to void at this time.

## 2019-01-24 NOTE — ED Notes (Signed)
Pt is being discharged to home. Aox4, VSS, pt does not c/o any pain at this time. AVS was given and explained to the pt and he was told to take his at home bp medications as soon as he get home and he verbalized understanding of all information.

## 2019-01-24 NOTE — ED Triage Notes (Signed)
Has catheter urinary in.  Says he has had issues where it is not going in bag while he is sleeping.  Then he change position and it gets better.

## 2019-01-24 NOTE — Discharge Instructions (Signed)
Please return to the ER if you are unable to urinate.   Call the [facility] and request a follow up with primary care if unable to schedule with the urologist within the next couple of weeks.

## 2019-01-24 NOTE — ED Provider Notes (Signed)
[facility] Emergency Department Provider Note  ____________________________________________  Time seen: Approximately 11:41 AM  I have reviewed the triage vital signs and the nursing notes.   HISTORY  Chief Complaint Urinary Retention    HPI Jeffrey Knight is a 58 y.o. male who presents to the emergency department for treatment and evaluation of foley catheter dysfunction. It was placed 5 days ago after experiencing urinary retention likely related to BPH. He was to follow up with the urologist at the [facility], but has been unable to get an appointment. He states that his catheter has been leaking since he got home, especially at night and has noticed some blood on the catheter today. He denies other concerns or complaints including abdominal pain, flank pain, fever, or chills.   Past Medical History:  Diagnosis Date  . Hypertension     Patient Active Problem List   Diagnosis Date Noted  . NSTEMI (non-ST elevated myocardial infarction) ([place]) 02/08/2018  . Type 2 diabetes mellitus without complication ([place]) 01/11/8117  . BPH (benign prostatic hyperplasia) 10/24/2016  . Asthma 09/28/2016  . Hypertension, essential 09/28/2016  . IBS (irritable bowel syndrome) 09/28/2016  . Lumbar stenosis 09/28/2016  . Obstructive sleep apnea 09/28/2016    Past Surgical History:  Procedure Laterality Date  . CORONARY STENT INTERVENTION N/A 02/09/2018   Procedure: CORONARY STENT INTERVENTION;  Surgeon: Isaias Cowman, MD;  Location: [place] CV LAB;  Service: Cardiovascular;  Laterality: N/A;  . KNEE SURGERY Left   . LEFT HEART CATH AND CORONARY ANGIOGRAPHY N/A 02/09/2018   Procedure: LEFT HEART CATH AND CORONARY ANGIOGRAPHY;  Surgeon: Isaias Cowman, MD;  Location: [place] [facility];  Service: Cardiovascular;  Laterality: N/A;    Prior to Admission medications   Medication Sig Start Date End Date Taking? Authorizing Provider  aspirin 81 MG chewable tablet  Chew 1 tablet (81 mg total) by mouth daily. 02/11/18   Dustin Flock, MD  atorvastatin (LIPITOR) 40 MG tablet Take 1 tablet (40 mg total) by mouth daily at 6 PM. 02/10/18   Dustin Flock, MD  blood glucose meter kit and supplies KIT Dispense based on patient and insurance preference. Use up to four times daily as directed. (FOR ICD-9 250.00, 250.01). 02/10/18   Dustin Flock, MD  clopidogrel (PLAVIX) 75 MG tablet Take 1 tablet (75 mg total) by mouth daily with breakfast. 02/11/18   Dustin Flock, MD  dicyclomine (BENTYL) 20 MG tablet Take 20 mg by mouth 3 (three) times daily. 01/07/16   [provider]  finasteride (PROSCAR) 5 MG tablet Take 5 mg by mouth daily. 10/21/16   [provider]  losartan (COZAAR) 100 MG tablet Take 100 mg by mouth daily.    [provider]  losartan (COZAAR) 25 MG tablet Take 1 tablet (25 mg total) by mouth daily. 02/10/18   Dustin Flock, MD  meloxicam (MOBIC) 7.5 MG tablet Take 7.5 mg by mouth daily. 07/14/16   [provider]  metFORMIN (GLUCOPHAGE) 500 MG tablet Take 1 tablet (500 mg total) by mouth 2 (two) times daily with a meal. Patient not taking: Reported on 02/21/2018 02/10/18 02/10/19  Dustin Flock, MD  metoprolol tartrate (LOPRESSOR) 50 MG tablet Take 1 tablet (50 mg total) by mouth 2 (two) times daily. 02/10/18   Dustin Flock, MD  nitroGLYCERIN (NITROSTAT) 0.4 MG SL tablet Place 1 tablet (0.4 mg total) under the tongue every 5 (five) minutes as needed for chest pain. 02/10/18   Dustin Flock, MD  pioglitazone (ACTOS) 15  MG tablet Take 15 mg by mouth daily. 08/22/18   [provider]  tamsulosin (FLOMAX) 0.4 MG CAPS capsule Take 0.4 mg by mouth daily.    [provider]    Allergies Codeine  Family History  Problem Relation Age of Onset  . CAD Mother   . CAD Father     Social History Social History   Tobacco Use  . Smoking status: Never Smoker  . Smokeless tobacco: Never Used  Substance  Use Topics  . Alcohol use: Not Currently  . Drug use: Not on file    Review of Systems Constitutional: Negative for fever. Respiratory: Negative for shortness of breath or cough. Gastrointestinal: Negative for abdominal pain; negative for nausea , negative for vomiting. Genitourinary: N/a for dysuria. Musculoskeletal: Negative for back pain. Skin: Negative for acute skin changes/rash/lesion. ____________________________________________   PHYSICAL EXAM:  VITAL SIGNS: ED Triage Vitals  Enc Vitals Group     BP 01/24/19 0948 (!) 163/98     Pulse Rate 01/24/19 0948 88     Resp 01/24/19 0948 16     Temp 01/24/19 0948 98.3 F (36.8 C)     Temp Source 01/24/19 0948 Oral     SpO2 01/24/19 0948 100 %     Weight 01/24/19 0914 294 lb 15.6 oz (133.8 kg)     Height 01/24/19 0914 _0  (1.88 m)     Head Circumference --      Peak Flow --      Pain Score 01/24/19 0914 0     Pain Loc --      Pain Edu? --      Excl. in [place]? --     Constitutional: Alert and oriented. Well appearing and in no acute distress. Eyes: Conjunctivae are normal. Head: Atraumatic. Nose: No congestion/rhinnorhea. Mouth/Throat: Mucous membranes are moist. Respiratory: Normal respiratory effort.  No retractions. Gastrointestinal: Bowel sounds active x 4; Abdomen is soft without rebound or guarding. Genitourinary: No tenderness over the suprapubic area. 56m of water in balloon of foley. Urine is noted in the leg bag.  Musculoskeletal: No extremity tenderness nor edema.  Neurologic:  Normal speech and language. No gross focal neurologic deficits are appreciated. Speech is normal. No gait instability. Skin:  Skin is warm, dry and intact. No rash noted on exposed skin. Psychiatric: Mood and affect are normal. Speech and behavior are normal.  ____________________________________________   LABS (all labs ordered are listed, but only abnormal results are displayed)  Labs Reviewed - No data to  display ____________________________________________  RADIOLOGY  Not indicated. ____________________________________________  Procedures  ____________________________________________  58year old male presents to the emergency department for complications related to Foley catheter that was inserted here 5 days ago.  Patient states that he has had some occasional difficulty urinating but had never had any urinary retention that required intervention until this episode.  He denies having taken any type of anticholinergics surrounding the time of the problem.  Today, the patient is requesting removal of the Foley catheter.  He states that it is leaking at night.  He states that [facility] him it is going to be several months before he can get an appointment with urology.  He does have an appointment February 06, 2019 with primary care.  Plan will be to clamp the catheter and allow the bladder to fill, then once he has a sensation that he needs to urinate, the catheter will be removed and we can see if he is able to urinate  without complication.  ----------------------------------------- 11:59 AM on 01/24/2019 -----------------------------------------  Patient was able to void 175 mL of straw-colored urine.  He was encouraged to keep his appointment on April 6 with his primary care provider and request follow-up with urology.  He is aware that he will need to return to the emergency department if he begins to have urinary retention again.  INITIAL IMPRESSION / ASSESSMENT AND PLAN / ED COURSE  Pertinent labs & imaging results that were available during my care of the patient were reviewed by me and considered in my medical decision making (see chart for details).  ____________________________________________   FINAL CLINICAL IMPRESSION(S) / ED DIAGNOSES  Final diagnoses:  Complication of Foley catheter, initial encounter [facility])    Note:  This document was prepared using Dragon voice  recognition software and may include unintentional dictation errors.   Victorino Dike, FNP 01/24/19 1200    Earleen Newport, MD 01/24/19 1421

## 2019-01-26 ENCOUNTER — Encounter: Payer: Self-pay | Admitting: Urology

## 2019-01-26 ENCOUNTER — Ambulatory Visit: Payer: Federal, State, Local not specified - PPO | Admitting: Urology

## 2019-06-30 IMAGING — CR DG CHEST 2V
2 series · 2 of 2 positions shown · non-contrast
Comparison: None

CLINICAL DATA: Midsternal chest pain x2 days

EXAM:
CHEST - 2 VIEW

[chest pa]
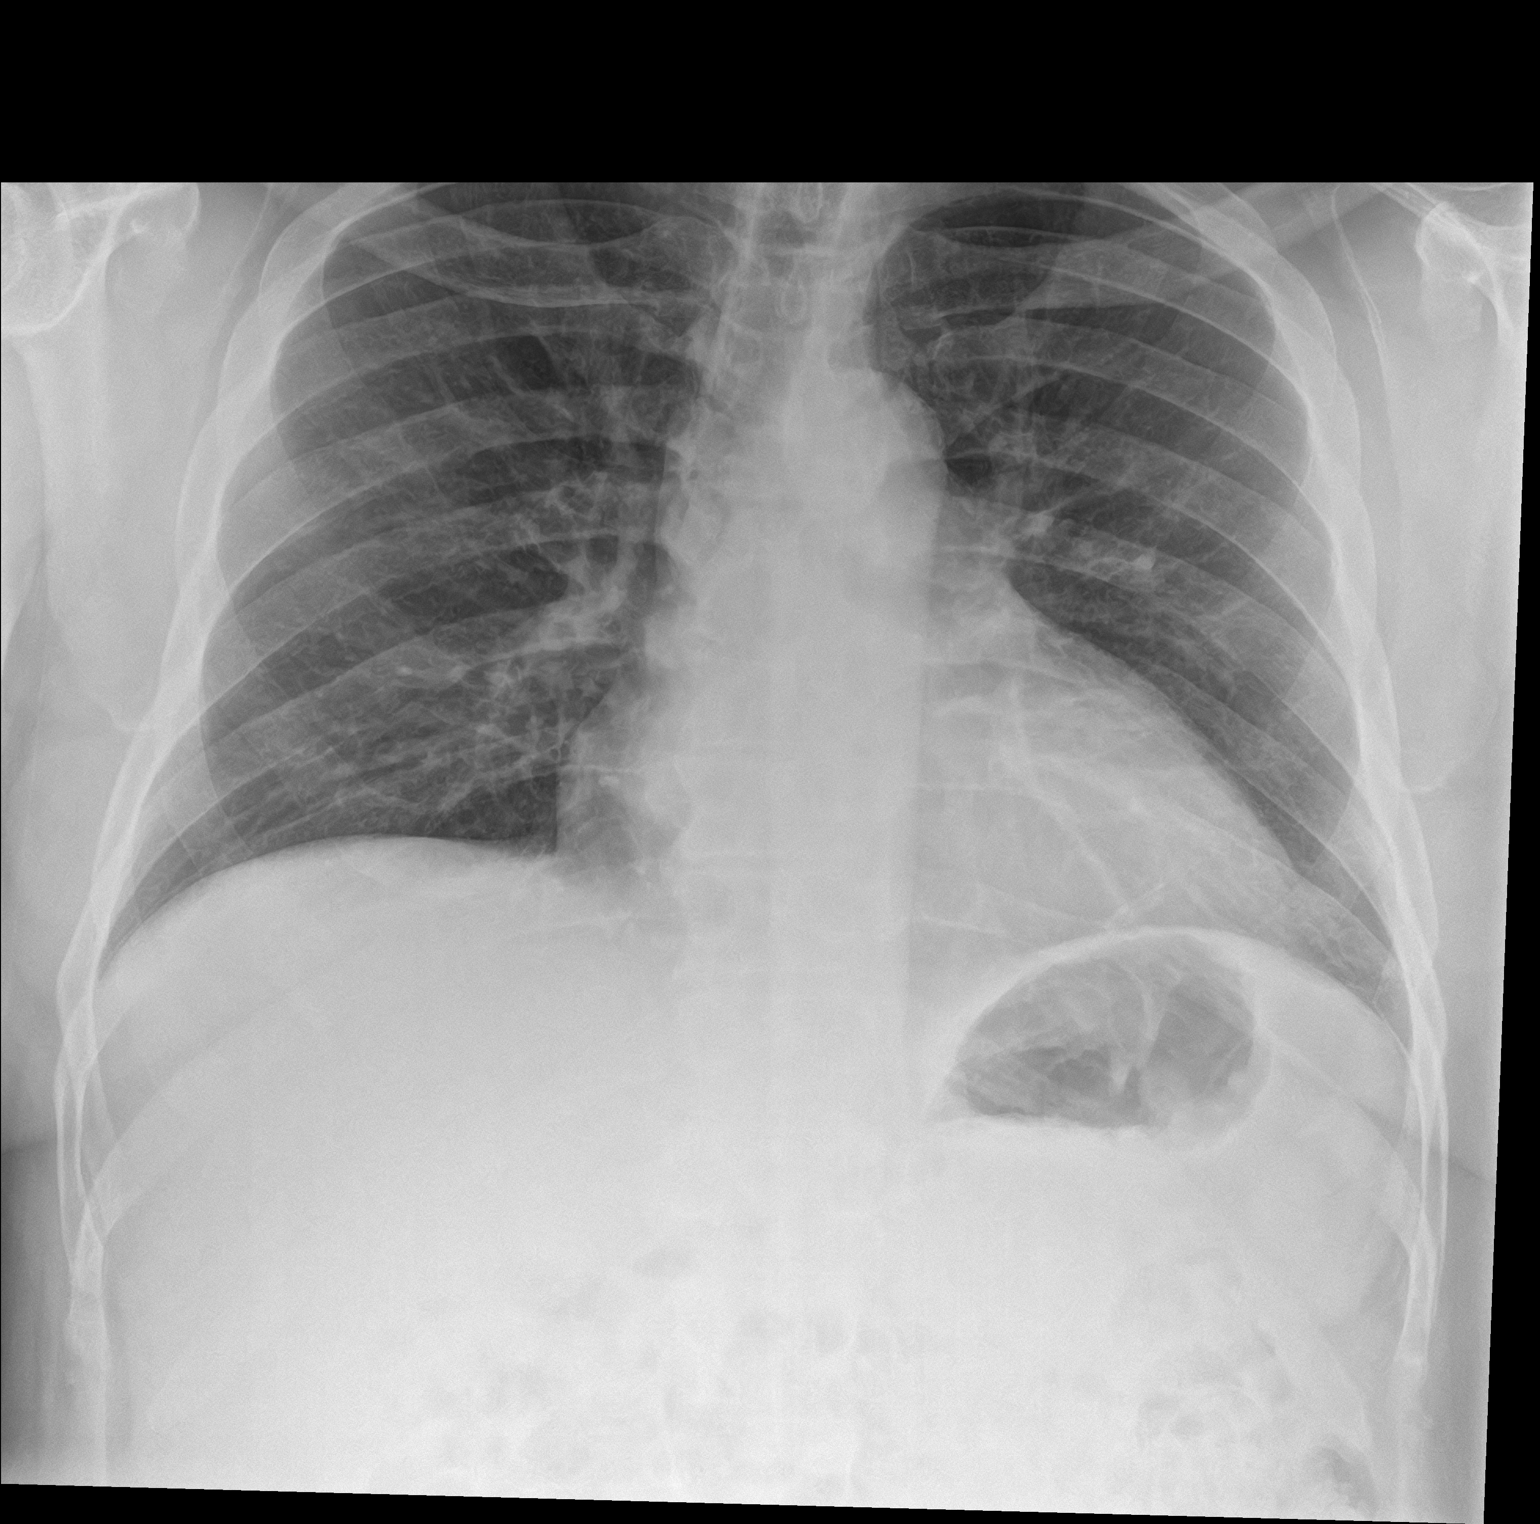

[chest lat]
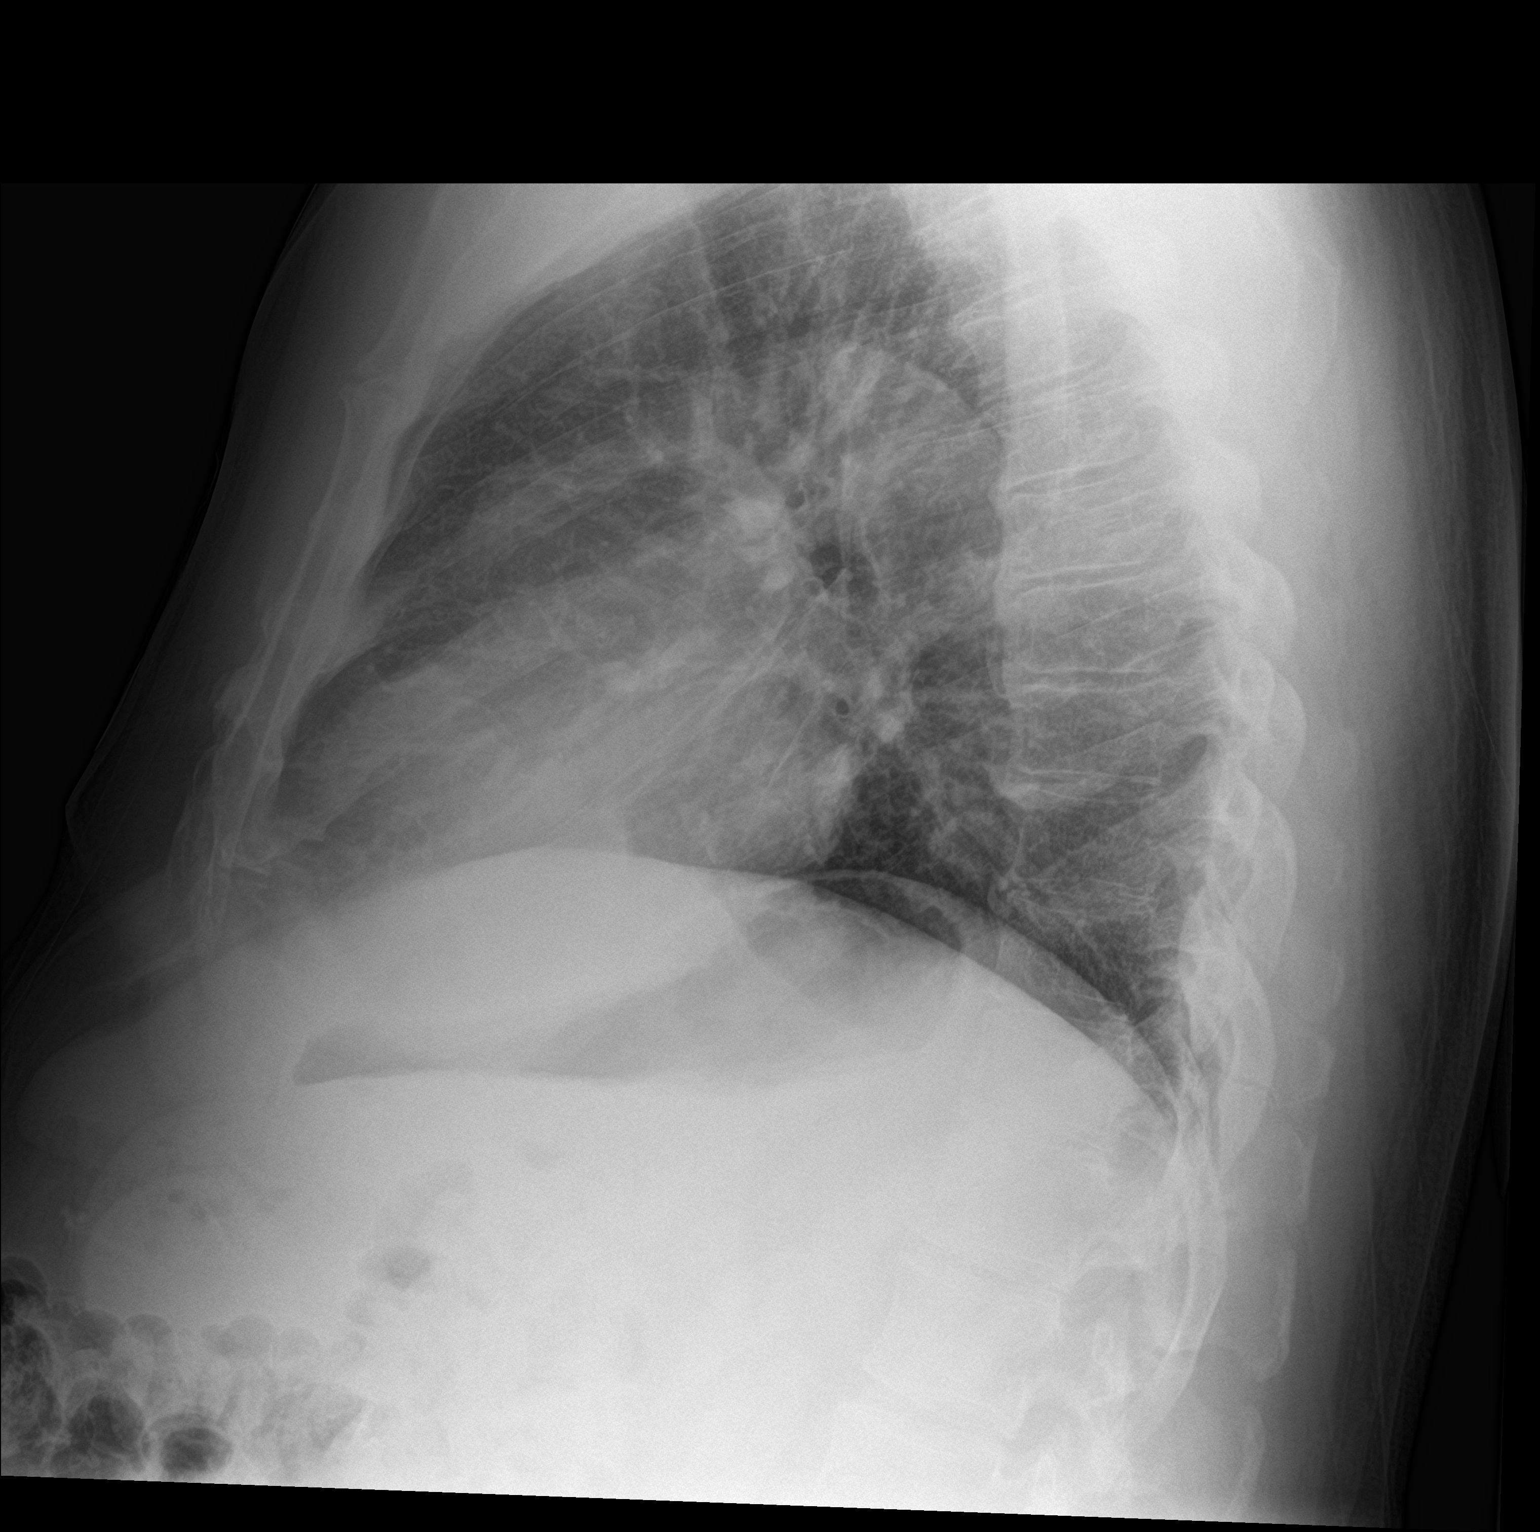

[2 of 2 positions shown; findings below may reference images not displayed]

FINDINGS: Mild cardiac enlargement. No aortic aneurysm. Clear lungs with
minimal left basilar atelectasis. No effusion or pneumothorax.
Degenerative change along the dorsal spine.
IMPRESSION: Mild cardiomegaly without active pulmonary disease.

## 2020-06-09 IMAGING — CT CT RENAL STONE PROTOCOL
2 of 4 series · 16 of 46 positions shown, 18 images · non-contrast
Comparison: None.

CLINICAL DATA: Onset difficulty urinating today.

EXAM:
CT ABDOMEN AND PELVIS WITHOUT CONTRAST
TECHNIQUE: Multidetector CT imaging of the abdomen and pelvis was performed
following the standard protocol without IV contrast.

[Series 2: stone full standard · axial · 0.84mm/px · z∈[-529,-64]mm · 13 of 103 slices shown, 15 images]
[im 5/103  soft-tissue]
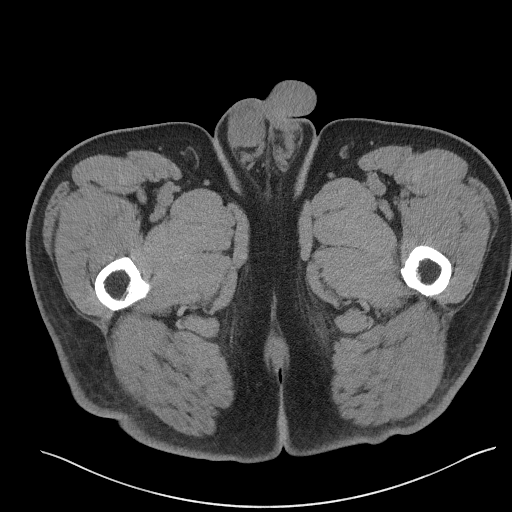
[im 5/103  bone]
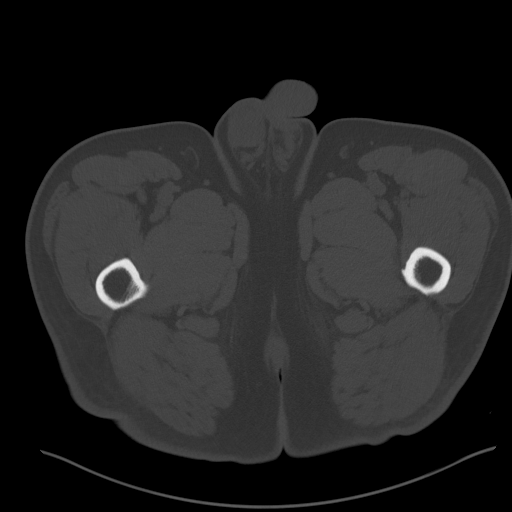
[im 13/103  soft-tissue]
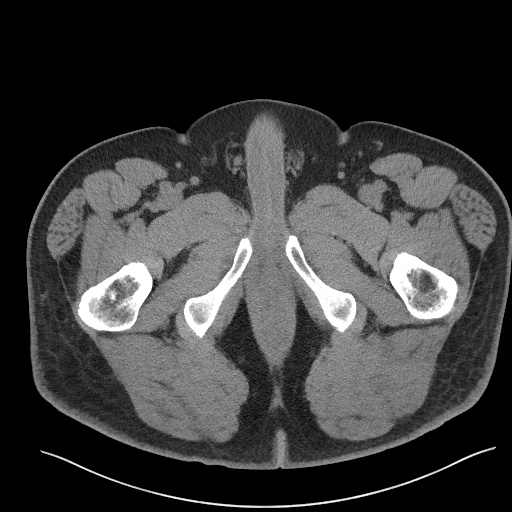
[im 22/103  soft-tissue]
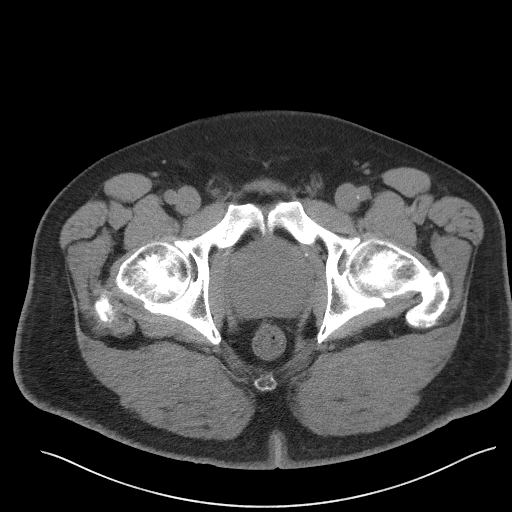
[im 30/103  soft-tissue]
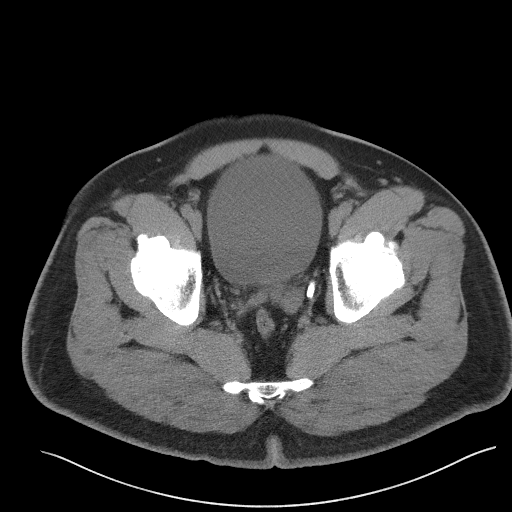
[im 35/103  soft-tissue]
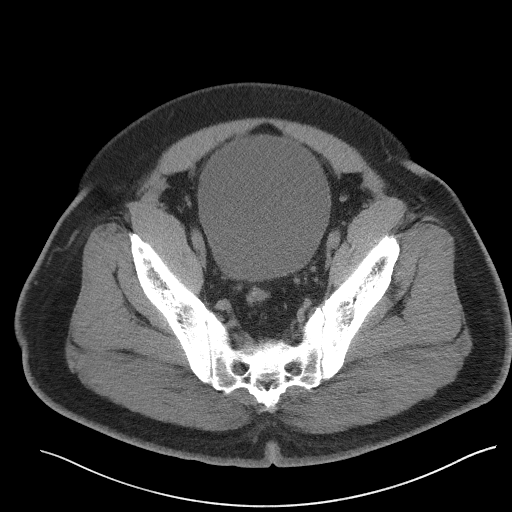
[im 43/103  soft-tissue]
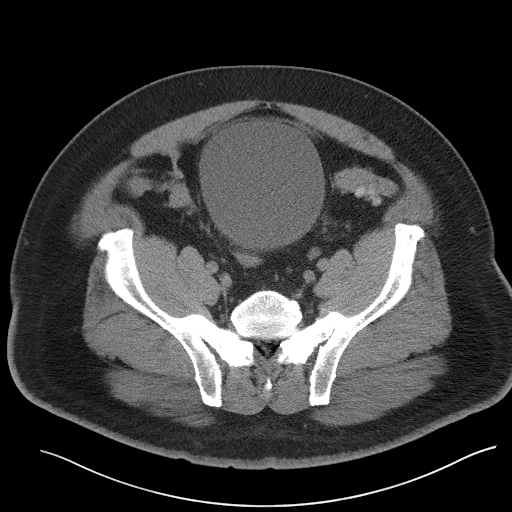
[im 52/103  soft-tissue]
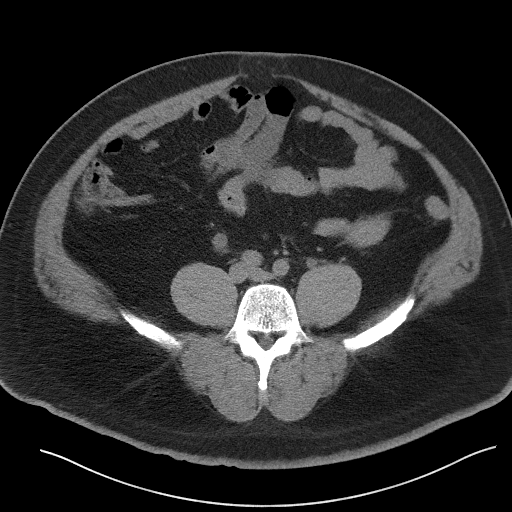
[im 60/103  soft-tissue]
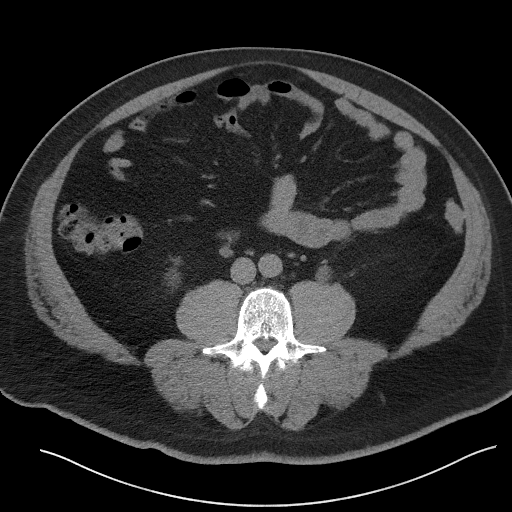
[im 69/103  soft-tissue]
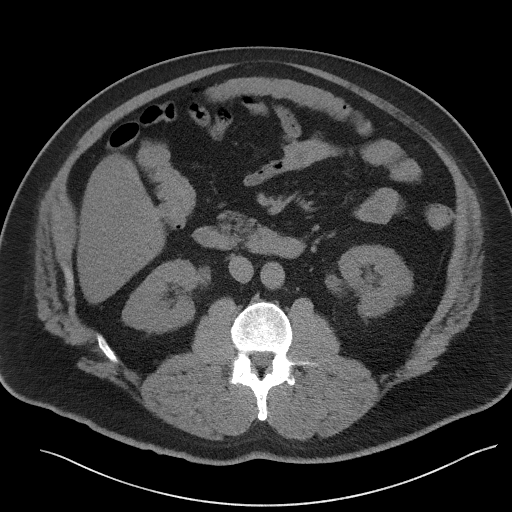
[im 69/103  bone]
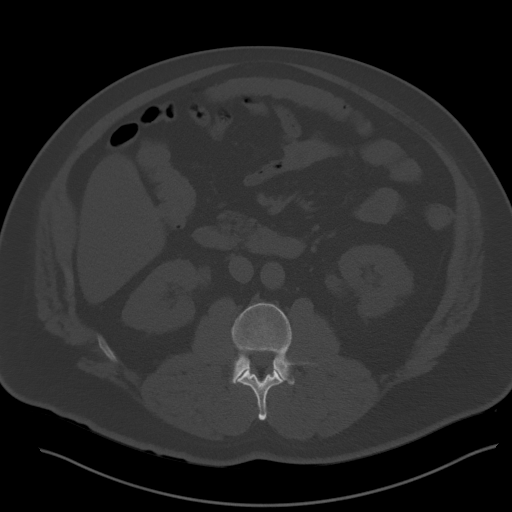
[im 73/103  soft-tissue]
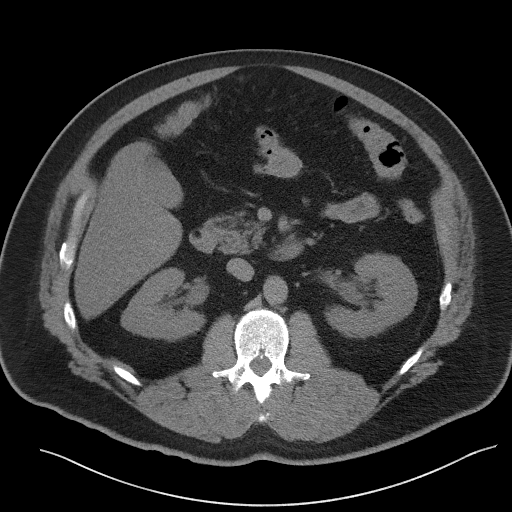
[im 81/103  soft-tissue]
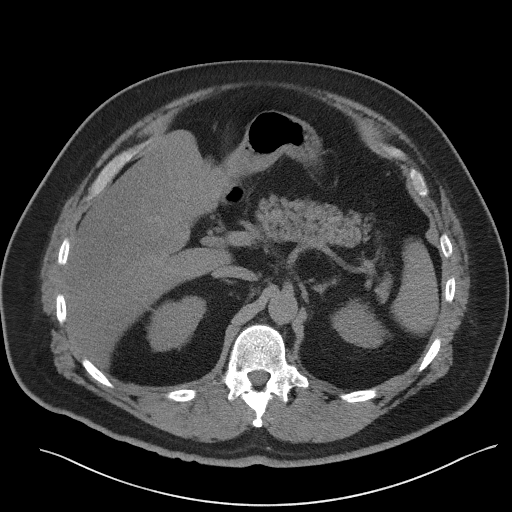
[im 90/103  soft-tissue]
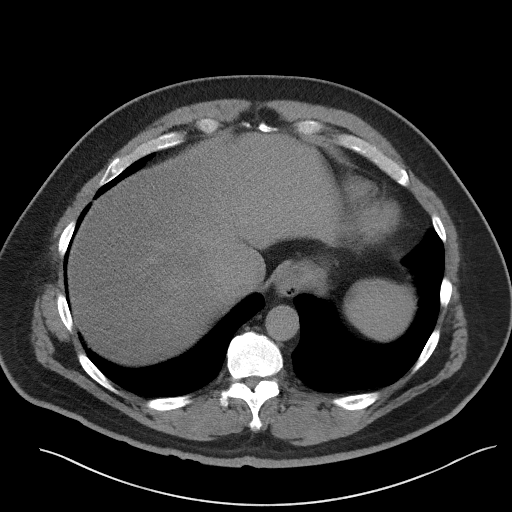
[im 98/103  soft-tissue]
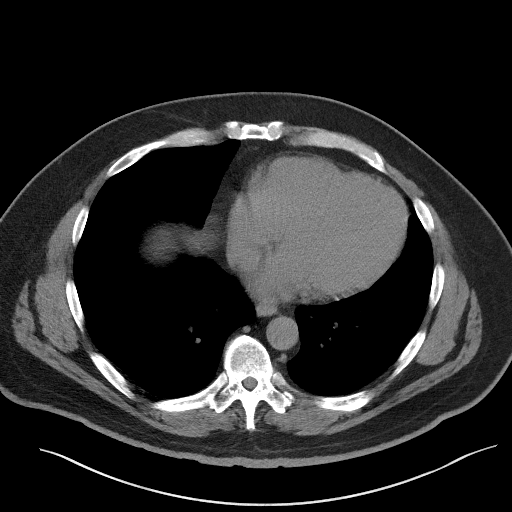

[Series 5: coronal · coronal · 0.98mm/px · 3 of 176 slices shown]
[im 59/176  soft-tissue]
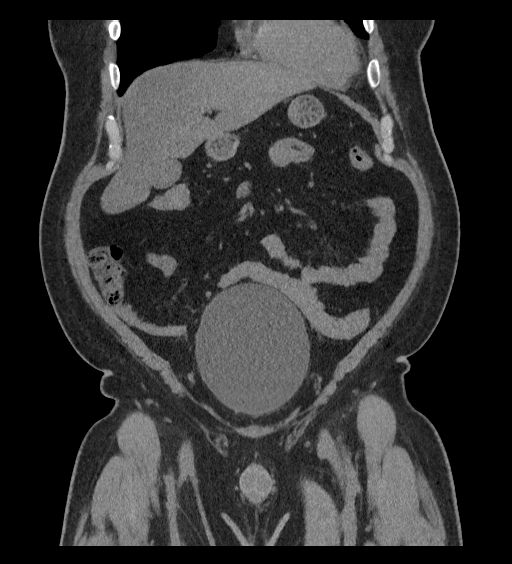
[im 78/176  soft-tissue]
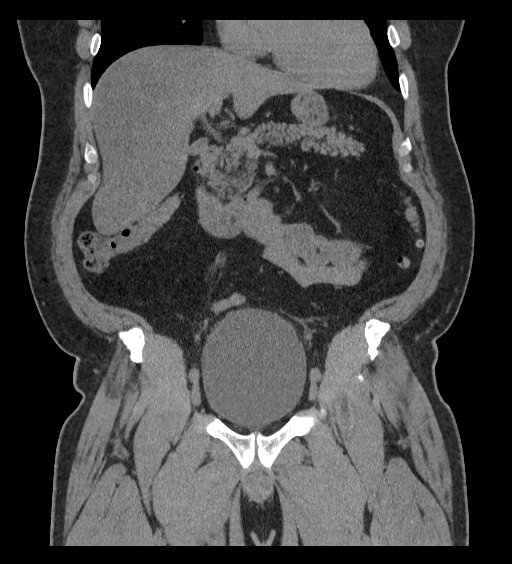
[im 98/176  soft-tissue]
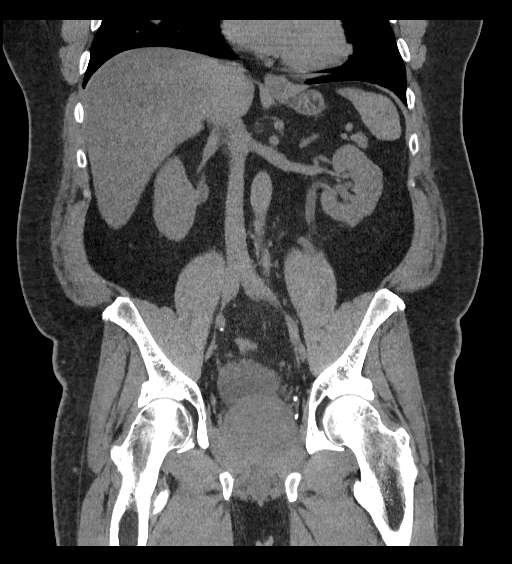

[16 of 46 positions shown; findings below may reference images not displayed]

FINDINGS: Lower chest: Lung bases are clear. Heart size is normal. No pleural
or pericardial effusion.

Hepatobiliary: No focal lesion. The liver is low attenuating
consistent with fatty infiltration. Gallbladder and biliary tree
appear normal.

Pancreas: Unremarkable. No pancreatic ductal dilatation or
surrounding inflammatory changes.

Spleen: Normal in size without focal abnormality.

Adrenals/Urinary Tract: The adrenal glands appear normal. No urinary
tract stones are identified. Mild fullness of the intrarenal
collecting systems is present bilaterally. The bladder is distended.
Marked prostatomegaly is seen. No focal prostate lesion.

Stomach/Bowel: There is some diverticulosis of the descending and
sigmoid colon without diverticulitis. The colon otherwise appears
normal. The stomach, small bowel and appendix appear normal.

Vascular/Lymphatic: No significant vascular findings are present. No
enlarged abdominal or pelvic lymph nodes.

Reproductive: Prostatomegaly is noted above.

Other: Small fat containing umbilical and supraumbilical hernia
noted.

Musculoskeletal: No acute or focal lesion. There is some
degenerative change about the hips. Degenerative disc disease L4-5
also noted.
IMPRESSION: Distention of the urinary bladder is most likely due to marked
prostatomegaly.

Negative for urinary tract stone.

Fatty infiltration of the liver.

Small fat containing umbilical and supraumbilical hernia.

## 2020-09-09 ENCOUNTER — Other Ambulatory Visit: Payer: Self-pay

## 2020-09-09 ENCOUNTER — Encounter: Payer: Self-pay | Admitting: *Deleted

## 2020-09-09 ENCOUNTER — Emergency Department
Admission: EM | Admit: 2020-09-09 | Discharge: 2020-09-09 | Disposition: A | Discharge status: Home / Self Care | Payer: Federal, State, Local not specified - PPO | Attending: Emergency Medicine | Admitting: Emergency Medicine

## 2020-09-09 DIAGNOSIS — Z79899 Other long term (current) drug therapy: Secondary | ICD-10-CM | POA: Insufficient documentation

## 2020-09-09 DIAGNOSIS — I10 Essential (primary) hypertension: Secondary | ICD-10-CM | POA: Diagnosis present

## 2020-09-09 DIAGNOSIS — Z7901 Long term (current) use of anticoagulants: Secondary | ICD-10-CM | POA: Diagnosis not present

## 2020-09-09 DIAGNOSIS — Z7982 Long term (current) use of aspirin: Secondary | ICD-10-CM | POA: Diagnosis not present

## 2020-09-09 DIAGNOSIS — Z7984 Long term (current) use of oral hypoglycemic drugs: Secondary | ICD-10-CM | POA: Diagnosis not present

## 2020-09-09 DIAGNOSIS — E119 Type 2 diabetes mellitus without complications: Secondary | ICD-10-CM | POA: Diagnosis not present

## 2020-09-09 DIAGNOSIS — J45909 Unspecified asthma, uncomplicated: Secondary | ICD-10-CM | POA: Diagnosis not present

## 2020-09-09 LAB — BASIC METABOLIC PANEL
Anion gap: 10 (ref 5–15)
BUN: 17 mg/dL (ref 6–20)
CO2: 27 mmol/L (ref 22–32)
Calcium: 9.5 mg/dL (ref 8.9–10.3)
Chloride: 102 mmol/L (ref 98–111)
Creatinine, Ser: 1.31 mg/dL — ABNORMAL HIGH (ref 0.61–1.24)
GFR, Estimated: >60 mL/min (ref >60)
Glucose, Bld: 173 mg/dL — ABNORMAL HIGH (ref 70–99)
Potassium: 3.7 mmol/L (ref 3.5–5.1)
Sodium: 139 mmol/L (ref 135–145)

## 2020-09-09 LAB — CBC
HCT: 40.2 % (ref 39.0–52.0)
Hemoglobin: 13.5 g/dL (ref 13.0–17.0)
MCH: 28.1 pg (ref 26.0–34.0)
MCHC: 33.6 g/dL (ref 30.0–36.0)
MCV: 83.6 fL (ref 80.0–100.0)
Platelets: 232 10*3/uL (ref 150–400)
RBC: 4.81 MIL/uL (ref 4.22–5.81)
RDW: 12.3 % (ref 11.5–15.5)
WBC: 7.4 10*3/uL (ref 4.0–10.5)
nRBC: 0 % (ref 0.0–0.2)

## 2020-09-09 LAB — TROPONIN I (HIGH SENSITIVITY)
Troponin I (High Sensitivity): 15 ng/L (ref <18)
Troponin I (High Sensitivity): 17 ng/L (ref <18)

## 2020-09-09 MED ORDER — AMLODIPINE BESYLATE 5 MG PO TABS
5.0000 mg | ORAL_TABLET | ORAL | Status: AC
  Administered 2020-09-09: 5 mg via ORAL
  Filled 2020-09-09: qty 1

## 2020-09-09 MED ORDER — AMLODIPINE BESYLATE 5 MG PO TABS: 5.0000 mg | ORAL_TABLET | Freq: Every day | ORAL | 1 refills | Status: AC

## 2020-09-09 NOTE — ED Notes (Signed)
Pt signed physical copy of dc paper and paper sent to records.

## 2020-09-09 NOTE — ED Provider Notes (Signed)
[facility] Emergency Department Provider Note  ____________________________________________   First MD Initiated Contact with Patient 09/09/20 0131     (approximate)  I have reviewed the triage vital signs and the nursing notes.   HISTORY  Chief Complaint Hypertension    HPI Jeffrey Knight is a 59 y.o. male with medical history as listed below (which most notably includes MI status post stent about 2 years ago and essential hypertension) but who is generally in good health though he states he has been treated for high blood pressure since 1986.   He presents tonight for evaluation of hypertension.  He said that he had a little bit of a headache today but it is not bothering him.  He checked his blood pressure and saw that it was elevated above normal.  He checked it multiple times, "maybe a dozen", throughout the day and each time he checked it it was higher.  He took his regular losartan which is his only antihypertensive.  About a month ago his dose was increased from losartan 50 mg to 100 mg daily.  When it did not seem to be helping he took maybe 4 or 5 doses of it and still his blood pressure did not come down.  He took a nitroglycerin which he knew would lower his blood pressure even though he has not had any chest pain.  His blood pressure briefly went down to the 130s but then it came back up again to the 150s and then he kept rechecking it and it kept going higher so he came to the emergency department.  He denies having chest pain at any point.  He also denies shortness of breath.  He is ambulatory without difficulty and does not feel lightheaded, dizzy, nor weak.  He has occasionally had a mild headache and nothing in particular makes any of his symptoms better or worse.  He denies nausea, vomiting, and abdominal pain.  No recent medication changes other than doubling his losartan dose about a month ago.  He states that he was on Lopressor as per Dr.  Saralyn Pilar his cardiologist but that was only for about a year after his MI and he has not been on it for at least a year.  He describes his hypertension tonight as severe.        Past Medical History:  Diagnosis Date  . Hypertension   . MI (myocardial infarction) ([place]) 2019    Patient Active Problem List   Diagnosis Date Noted  . S/P cardiac cath 02/09/2018  . NSTEMI (non-ST elevated myocardial infarction) ([place]) 02/08/2018  . Type 2 diabetes mellitus without complication ([place]) 39/76/7341  . BPH (benign prostatic hyperplasia) 10/24/2016  . Acute kidney injury ([place]) 10/24/2016  . Obesity, Class II, BMI 35-39.9 10/24/2016  . Asthma 09/28/2016  . Hypertension, essential 09/28/2016  . IBS (irritable bowel syndrome) 09/28/2016  . Lumbar stenosis 09/28/2016  . Obstructive sleep apnea 09/28/2016  . Elevated PSA 10/19/2015    Past Surgical History:  Procedure Laterality Date  . CORONARY STENT INTERVENTION N/A 02/09/2018   Procedure: CORONARY STENT INTERVENTION;  Surgeon: Isaias Cowman, MD;  Location: [place] [facility];  Service: Cardiovascular;  Laterality: N/A;  . KNEE SURGERY Left   . LEFT HEART CATH AND CORONARY ANGIOGRAPHY N/A 02/09/2018   Procedure: LEFT HEART CATH AND CORONARY ANGIOGRAPHY;  Surgeon: Isaias Cowman, MD;  Location: [place] [facility];  Service: Cardiovascular;  Laterality: N/A;    Prior to Admission medications  Medication Sig Start Date End Date Taking? Authorizing Provider  amLODipine (NORVASC) 5 MG tablet Take 1 tablet (5 mg total) by mouth daily. 09/09/20 09/09/21  Hinda Kehr, MD  aspirin 81 MG chewable tablet Chew 1 tablet (81 mg total) by mouth daily. 02/11/18   Dustin Flock, MD  atorvastatin (LIPITOR) 40 MG tablet Take 1 tablet (40 mg total) by mouth daily at 6 PM. 02/10/18   Dustin Flock, MD  blood glucose meter kit and supplies KIT Dispense based on patient and insurance preference. Use up to four times daily as directed. (FOR ICD-9  250.00, 250.01). 02/10/18   Dustin Flock, MD  clopidogrel (PLAVIX) 75 MG tablet Take 1 tablet (75 mg total) by mouth daily with breakfast. 02/11/18   Dustin Flock, MD  dicyclomine (BENTYL) 20 MG tablet Take 20 mg by mouth 3 (three) times daily. 01/07/16   [provider]  finasteride (PROSCAR) 5 MG tablet Take 5 mg by mouth daily. 10/21/16   [provider]  losartan (COZAAR) 100 MG tablet Take 100 mg by mouth daily.    [provider]  losartan (COZAAR) 25 MG tablet Take 1 tablet (25 mg total) by mouth daily. 02/10/18   Dustin Flock, MD  meloxicam (MOBIC) 7.5 MG tablet Take 7.5 mg by mouth daily. 07/14/16   [provider]  metFORMIN (GLUCOPHAGE) 500 MG tablet Take 1 tablet (500 mg total) by mouth 2 (two) times daily with a meal. Patient not taking: Reported on 02/21/2018 02/10/18 02/10/19  Dustin Flock, MD  metoprolol tartrate (LOPRESSOR) 50 MG tablet Take 1 tablet (50 mg total) by mouth 2 (two) times daily. 02/10/18   Dustin Flock, MD  nitroGLYCERIN (NITROSTAT) 0.4 MG SL tablet Place 1 tablet (0.4 mg total) under the tongue every 5 (five) minutes as needed for chest pain. 02/10/18   Dustin Flock, MD  pioglitazone (ACTOS) 15 MG tablet Take 15 mg by mouth daily. 08/22/18   [provider]  tamsulosin (FLOMAX) 0.4 MG CAPS capsule Take 0.4 mg by mouth daily.    [provider]    Allergies Codeine  Family History  Problem Relation Age of Onset  . CAD Mother   . CAD Father     Social History Social History   Tobacco Use  . Smoking status: Never Smoker  . Smokeless tobacco: Never Used  Substance Use Topics  . Alcohol use: Not Currently  . Drug use: Not on file    Review of Systems Constitutional: No fever/chills Eyes: No visual changes. ENT: No sore throat. Cardiovascular: Denies chest pain. Respiratory: Denies shortness of breath. Gastrointestinal: No abdominal pain.  No nausea, no vomiting.  No diarrhea.  No  constipation. Genitourinary: Negative for dysuria. Musculoskeletal: Negative for neck pain.  Negative for back pain. Integumentary: Negative for rash. Neurological: Negative for headaches, focal weakness or numbness.   ____________________________________________   PHYSICAL EXAM:  VITAL SIGNS: ED Triage Vitals [09/09/20 0035]  Enc Vitals Group     BP (!) 172/103     Pulse Rate 87     Resp 16     Temp 98.7 F (37.1 C)     Temp Source Oral     SpO2 99 %     Weight 124.7 kg (275 lb)     Height 1.88 m ($Remov'6\' 2"'lzzeuH$ )     Head Circumference      Peak Flow      Pain Score 6     Pain Loc      Pain Edu?  Excl. in [place]?     Constitutional: Alert and oriented.  Eyes: Conjunctivae are normal.  Head: Atraumatic. Nose: No congestion/rhinnorhea. Mouth/Throat: Patient is wearing a mask. Neck: No stridor.  No meningeal signs.   Cardiovascular: Normal rate, regular rhythm. Good peripheral circulation. Grossly normal heart sounds. Respiratory: Normal respiratory effort.  No retractions. Gastrointestinal: Soft and nontender. No distention.  Musculoskeletal: No lower extremity tenderness nor edema. No gross deformities of extremities. Neurologic:  Normal speech and language. No gross focal neurologic deficits are appreciated.  Skin:  Skin is warm, dry and intact. Psychiatric: Mood and affect are normal. Speech and behavior are normal.  ____________________________________________   LABS (all labs ordered are listed, but only abnormal results are displayed)  Labs Reviewed  BASIC METABOLIC PANEL - Abnormal; Notable for the following components:      Result Value   Glucose, Bld 173 (*)    Creatinine, Ser 1.31 (*)    All other components within normal limits  CBC  TROPONIN I (HIGH SENSITIVITY)  TROPONIN I (HIGH SENSITIVITY)   ____________________________________________  EKG  ED ECG REPORT I, Hinda Kehr, the attending physician, personally viewed and interpreted this  ECG.  Date: 09/09/2020 EKG Time: 00: 32 Rate: 82 Rhythm: normal sinus rhythm QRS Axis: normal Intervals: normal ST/T Wave abnormalities: Inverted T waves in leads III and V6, otherwise unremarkable Narrative Interpretation: no definitive evidence of acute ischemia; does not meet STEMI criteria.   ____________________________________________  RADIOLOGY I, Hinda Kehr, personally viewed and evaluated these images (plain radiographs) as part of my medical decision making, as well as reviewing the written report by the radiologist.  ED MD interpretation: No indication for emergent imaging  Official radiology report(s): No results found.  ____________________________________________   PROCEDURES   Procedure(s) performed (including Critical Care):  Procedures   ____________________________________________   INITIAL IMPRESSION / MDM / [place] / ED COURSE  As part of my medical decision making, I reviewed the following data within the [place] notes reviewed and incorporated, Labs reviewed , EKG interpreted , Old chart reviewed and Notes from prior ED visits   Differential diagnosis includes, but is not limited to, Essential hypertension, hypertensive emergency, acute glomerulonephritis, volume overload.  In spite of his chronic medical conditions, the patient is well-appearing and in no distress.  He admits that he has been asymptomatic throughout the day and at no point has had any chest pain.  He has not had any recent medication changes.  I had my usual asymptomatic hypertension discussion with patient and explained that I think that at least some degree of anxiety is contributing to his blood pressure being high given that every time he checked it it went up.  I counseled them that taking multiple doses of the losartan is not a good idea.  He says that he is certain anxiety is not an issue because he is "very chill" and he does seem  quite calm at this time but I explained that continually checking the blood pressure can artificially elevated.  His CBC is normal.  Metabolic panel is notable for slight elevation of his creatinine but his GFR is normal and he is a large and muscular man and he has had creatinine in the range of 1.2 in the past.  I do not feel that the creatinine of 1.3 tonight indicates any degree of acute kidney injury.  His troponin is slightly elevated at 15 which is of unclear clinical significance, particularly in the absence of  chest pain.  I am giving him a dose of amlodipine 5 mg by mouth and we will recheck a 2-hour troponin to see if it has changed and to see if the amlodipine is helping his blood pressure.  He has multiple options for outpatient follow-up and I anticipate this will likely be the plan of care.     Clinical Course as of Sep 09 509  Mon Sep 09, 2020  0502 Patient's repeat troponin is 17 which is insignificantly changed from his original at 50.  He continues to have no chest pain.  His blood pressure has improved somewhat.  It is still elevated but is down to the 160s over 90s.  Since he remains asymptomatic he is comfortable with the plan for discharge and outpatient follow-up.  I gave my usual and customary recommendations and return precautions and will also start him on amlodipine as I think this will help with his control.   [CF]    Clinical Course User Index [CF] Hinda Kehr, MD     ____________________________________________  FINAL CLINICAL IMPRESSION(S) / ED DIAGNOSES  Final diagnoses:  Essential hypertension     MEDICATIONS GIVEN DURING THIS VISIT:  Medications  amLODipine (NORVASC) tablet 5 mg (5 mg Oral Given 09/09/20 0217)     ED Discharge Orders         Ordered    amLODipine (NORVASC) 5 MG tablet  Daily        09/09/20 0507          *Please note:  Jeffrey Knight was evaluated in Emergency Department on 09/09/2020 for the symptoms described in the  history of present illness. He was evaluated in the context of the global COVID-19 pandemic, which necessitated consideration that the patient might be at risk for infection with the SARS-CoV-2 virus that causes COVID-19. Institutional protocols and algorithms that pertain to the evaluation of patients at risk for COVID-19 are in a state of rapid change based on information released by regulatory bodies including the CDC and federal and state organizations. These policies and algorithms were followed during the patient's care in the ED.  Some ED evaluations and interventions may be delayed as a result of limited staffing during and after the pandemic.*  Note:  This document was prepared using Dragon voice recognition software and may include unintentional dictation errors.   Hinda Kehr, MD 09/09/20 [phone]

## 2020-09-09 NOTE — Discharge Instructions (Signed)
As we discussed, though you do have high blood pressure (hypertension), fortunately it is not immediately dangerous at this time and does not need emergency intervention or admission to the hospital.  Given your hypertension tonight, I prescribed an additional medication for you to take in the morning.  Continue taking your losartan at night but do not take more than the prescribed amount.  Please follow up with your primary care doctor to evaluate you in clinic and decide if any medication changes are needed.   Return to the Emergency Department (ED) if you experience any worsening chest pain/pressure/tightness, difficulty breathing, or sudden sweating, or other symptoms that concern you.

## 2020-09-09 NOTE — ED Triage Notes (Signed)
Pt to ED reporting elevated BP today. Home reading of 190/110. Pt takes 100mg  of Losartan daily but took 500mg  today, 1 Nitro and 162mg  of Aspirin today without success. Pt arrived to ED reporting only symptom is a headache.

## 2020-09-09 NOTE — ED Notes (Signed)
Pt states that today he checked his blood pressure and that it has been high all day with the systolic in the 180's. Pt states he usually takes a lorsartan and that they  up'd the dose a few months ago but that today he took it 5 times with no lowering of his BP.  Pt states he has a headache that is a 5 out of 10.   Pt denies cp but states he's had slightly blurred vision. Pt took a nitro which did lower his pressure for a while but it did shoot back up.

## 2020-09-16 ENCOUNTER — Ambulatory Visit: Payer: Federal, State, Local not specified - PPO | Attending: Neurology

## 2020-09-16 DIAGNOSIS — G4733 Obstructive sleep apnea (adult) (pediatric): Secondary | ICD-10-CM | POA: Insufficient documentation

## 2020-09-17 ENCOUNTER — Other Ambulatory Visit: Payer: Self-pay

## 2020-09-26 ENCOUNTER — Emergency Department
Admission: EM | Admit: 2020-09-26 | Discharge: 2020-09-26 | Disposition: A | Discharge status: Home / Self Care | Payer: Federal, State, Local not specified - PPO | Attending: Emergency Medicine | Admitting: Emergency Medicine

## 2020-09-26 ENCOUNTER — Other Ambulatory Visit: Payer: Self-pay

## 2020-09-26 DIAGNOSIS — M545 Low back pain, unspecified: Secondary | ICD-10-CM | POA: Diagnosis present

## 2020-09-26 DIAGNOSIS — J45909 Unspecified asthma, uncomplicated: Secondary | ICD-10-CM | POA: Diagnosis not present

## 2020-09-26 DIAGNOSIS — E119 Type 2 diabetes mellitus without complications: Secondary | ICD-10-CM | POA: Diagnosis not present

## 2020-09-26 DIAGNOSIS — Z79899 Other long term (current) drug therapy: Secondary | ICD-10-CM | POA: Insufficient documentation

## 2020-09-26 DIAGNOSIS — I10 Essential (primary) hypertension: Secondary | ICD-10-CM | POA: Insufficient documentation

## 2020-09-26 DIAGNOSIS — Z7984 Long term (current) use of oral hypoglycemic drugs: Secondary | ICD-10-CM | POA: Diagnosis not present

## 2020-09-26 DIAGNOSIS — Z7982 Long term (current) use of aspirin: Secondary | ICD-10-CM | POA: Insufficient documentation

## 2020-09-26 MED ORDER — TRAMADOL HCL 50 MG PO TABS: 50.0000 mg | ORAL_TABLET | Freq: Four times a day (QID) | ORAL | 0 refills | Status: AC | PRN

## 2020-09-26 MED ORDER — METHYLPREDNISOLONE 4 MG PO TBPK: ORAL_TABLET | ORAL | 0 refills | Status: AC

## 2020-09-26 MED ORDER — ORPHENADRINE CITRATE 30 MG/ML IJ SOLN
60.0000 mg | Freq: Two times a day (BID) | INTRAMUSCULAR | Status: DC
  Administered 2020-09-26: 60 mg via INTRAMUSCULAR
  Filled 2020-09-26: qty 2

## 2020-09-26 MED ORDER — METHYLPREDNISOLONE SODIUM SUCC 125 MG IJ SOLR
125.0000 mg | Freq: Once | INTRAMUSCULAR | Status: AC
  Administered 2020-09-26: 125 mg via INTRAMUSCULAR
  Filled 2020-09-26: qty 2

## 2020-09-26 MED ORDER — ORPHENADRINE CITRATE ER 100 MG PO TB12: 100.0000 mg | ORAL_TABLET | Freq: Two times a day (BID) | ORAL | 0 refills | Status: AC

## 2020-09-26 NOTE — ED Provider Notes (Signed)
[facility] Emergency Department Provider Note   ____________________________________________   First MD Initiated Contact with Patient 09/26/20 1018     (approximate)  I have reviewed the triage vital signs and the nursing notes.   HISTORY  Chief Complaint Back Pain    HPI Adrik Khim is a 59 y.o. male patient presents with new onset of acute back pain with.  Yesterday while walking.  Patient state he is on a physical improvement program consisting of started vegetarian diet and increased exercise.  Patient state after the walking incident he started noticing pain in his lower back.  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  Patient is denying radicular component to his back pain.  Patient denies bladder or bowel dysfunction.  Past medical history MI.  Patient is not taking blood thinners at this time.         Past Medical History:  Diagnosis Date   Hypertension    MI (myocardial infarction) ([place]) 2019    Patient Active Problem List   Diagnosis Date Noted   S/P cardiac cath 02/09/2018   NSTEMI (non-ST elevated myocardial infarction) ([place]) 02/08/2018   Type 2 diabetes mellitus without complication ([place]) 17/00/1749   BPH (benign prostatic hyperplasia) 10/24/2016   Acute kidney injury ([place]) 10/24/2016   Obesity, Class II, BMI 35-39.9 10/24/2016   Asthma 09/28/2016   Hypertension, essential 09/28/2016   IBS (irritable bowel syndrome) 09/28/2016   Lumbar stenosis 09/28/2016   Obstructive sleep apnea 09/28/2016   Elevated PSA 10/19/2015    Past Surgical History:  Procedure Laterality Date   CORONARY STENT INTERVENTION N/A 02/09/2018   Procedure: CORONARY STENT INTERVENTION;  Surgeon: Isaias Cowman, MD;  Location: [facility];  Service: Cardiovascular;  Laterality: N/A;   KNEE SURGERY Left    LEFT HEART CATH AND CORONARY  ANGIOGRAPHY N/A 02/09/2018   Procedure: LEFT HEART CATH AND CORONARY ANGIOGRAPHY;  Surgeon: Isaias Cowman, MD;  Location: [facility];  Service: Cardiovascular;  Laterality: N/A;    Prior to Admission medications   Medication Sig Start Date End Date Taking? Authorizing Provider  amLODipine (NORVASC) 5 MG tablet Take 1 tablet (5 mg total) by mouth daily. 09/09/20 09/09/21  Hinda Kehr, MD  aspirin 81 MG chewable tablet Chew 1 tablet (81 mg total) by mouth daily. 02/11/18   Dustin Flock, MD  atorvastatin (LIPITOR) 40 MG tablet Take 1 tablet (40 mg total) by mouth daily at 6 PM. 02/10/18   Dustin Flock, MD  blood glucose meter kit and supplies KIT Dispense based on patient and insurance preference. Use up to four times daily as directed. (FOR ICD-9 250.00, 250.01). 02/10/18   Dustin Flock, MD  clopidogrel (PLAVIX) 75 MG tablet Take 1 tablet (75 mg total) by mouth daily with breakfast. 02/11/18   Dustin Flock, MD  dicyclomine (BENTYL) 20 MG tablet Take 20 mg by mouth 3 (three) times daily. 01/07/16   [provider]  finasteride (PROSCAR) 5 MG tablet Take 5 mg by mouth daily. 10/21/16   [provider]  losartan (COZAAR) 100 MG tablet Take 100 mg by mouth daily.    [provider]  losartan (COZAAR) 25 MG tablet Take 1 tablet (25 mg total) by mouth daily. 02/10/18   Dustin Flock, MD  meloxicam (MOBIC) 7.5 MG tablet Take 7.5 mg by mouth daily. 07/14/16   [provider]  metFORMIN (GLUCOPHAGE) 500 MG tablet Take  1 tablet (500 mg total) by mouth 2 (two) times daily with a meal. Patient not taking: Reported on 02/21/2018 02/10/18 02/10/19  Dustin Flock, MD  methylPREDNISolone (MEDROL DOSEPAK) 4 MG TBPK tablet Take Tapered dose as directed 09/26/20   Sable Feil, PA-C  metoprolol tartrate (LOPRESSOR) 50 MG tablet Take 1 tablet (50 mg total) by mouth 2 (two) times daily. 02/10/18   Dustin Flock, MD  nitroGLYCERIN (NITROSTAT) 0.4 MG SL tablet  Place 1 tablet (0.4 mg total) under the tongue every 5 (five) minutes as needed for chest pain. 02/10/18   Dustin Flock, MD  orphenadrine (NORFLEX) 100 MG tablet Take 1 tablet (100 mg total) by mouth 2 (two) times daily. 09/26/20   Sable Feil, PA-C  pioglitazone (ACTOS) 15 MG tablet Take 15 mg by mouth daily. 08/22/18   [provider]  tamsulosin (FLOMAX) 0.4 MG CAPS capsule Take 0.4 mg by mouth daily.    [provider]  traMADol (ULTRAM) 50 MG tablet Take 1 tablet (50 mg total) by mouth every 6 (six) hours as needed for moderate pain. 09/26/20   Sable Feil, PA-C    Allergies Codeine  Family History  Problem Relation Age of Onset   CAD Mother    CAD Father     Social History Social History   Tobacco Use   Smoking status: Never Smoker   Smokeless tobacco: Never Used  Substance Use Topics   Alcohol use: Not Currently   Drug use: Not on file    Review of Systems Constitutional: No fever/chills Eyes: No visual changes. ENT: No sore throat. Cardiovascular: Denies chest pain. Respiratory: Denies shortness of breath. Gastrointestinal: No abdominal pain.  No nausea, no vomiting.  No diarrhea.  No constipation. Genitourinary: Negative for dysuria. Musculoskeletal: Positive for back pain. Skin: Negative for rash. Neurological: Negative for headaches, focal weakness or numbness. Allergic/Immunilogical: Codeine ____________________________________________   PHYSICAL EXAM:  VITAL SIGNS: ED Triage Vitals  Enc Vitals Group     BP 09/26/20 1014 (!) 170/86     Pulse Rate 09/26/20 1013 99     Resp 09/26/20 1013 17     Temp 09/26/20 1013 99.2 F (37.3 C)     Temp Source 09/26/20 1013 Oral     SpO2 09/26/20 1013 99 %     Weight 09/26/20 1014 275 lb (124.7 kg)     Height 09/26/20 1014 $[id number]'6\' 2"'[id number]$  (1.88 m)     Head Circumference --      Peak Flow --      Pain Score 09/26/20 1014 6     Pain Loc --      Pain Edu? --      Excl. in [place]? --     Constitutional: Alert and oriented. Well appearing and in no acute distress.  BMI is 35.31. Eyes: Conjunctivae are normal. PERRL. EOMI. Head: Atraumatic. Nose: No congestion/rhinnorhea. Mouth/Throat: Mucous membranes are moist.  Oropharynx non-erythematous. Neck: No stridor.  No cervical spine tenderness to palpation. Cardiovascular: Normal rate, regular rhythm. Grossly normal heart sounds.  Good peripheral circulation.  Elevated blood pressure.  Patient took medication prior to arrival. Respiratory: Normal respiratory effort.  No retractions. Lungs CTAB. Gastrointestinal: Soft and nontender. No distention. No abdominal bruits. No CVA tenderness. Genitourinary: Deferred Musculoskeletal: No obvious lumbar spine deformity.  Patient decreased range of motion all fields.  Patient negative straight leg test in the supine position.  No lower extremity tenderness nor edema.  No joint effusions. Neurologic:  Normal speech and language.  No gross focal neurologic deficits are appreciated. No gait instability. Skin:  Skin is warm, dry and intact. No rash noted. Psychiatric: Mood and affect are normal. Speech and behavior are normal.  ____________________________________________   LABS (all labs ordered are listed, but only abnormal results are displayed)  Labs Reviewed - No data to display ____________________________________________  EKG   ____________________________________________  RADIOLOGY I, Sable Feil, personally viewed and evaluated these images (plain radiographs) as part of my medical decision making, as well as reviewing the written report by the radiologist.  ED MD interpretation:    Official radiology report(s): No results found.  ____________________________________________   PROCEDURES  Procedure(s) performed (including Critical Care):  Procedures   ____________________________________________   INITIAL IMPRESSION / ASSESSMENT AND PLAN / ED COURSE  As  part of my medical decision making, I reviewed the following data within the Echo         Patient presents with acute onset of low back pain secondary to increased physical activity.  Patient complaint physical exam consistent for low back pain.  Patient given Norflex and Solu-Medrol.  Patient given discharge care instructions.  Patient given prescription for Norflex, Medrol Dosepak, and tramadol.  Patient advised to monitor glucose levels while taking steroids.  Follow-up with PCP.   ____________________________________________   FINAL CLINICAL IMPRESSION(S) / ED DIAGNOSES  Final diagnoses:  Acute right-sided low back pain without sciatica     ED Discharge Orders         Ordered    orphenadrine (NORFLEX) 100 MG tablet  2 times daily        09/26/20 1031    traMADol (ULTRAM) 50 MG tablet  Every 6 hours PRN        09/26/20 1031    methylPREDNISolone (MEDROL DOSEPAK) 4 MG TBPK tablet        09/26/20 1031          *Please note:  Michael Walrath was evaluated in Emergency Department on 09/26/2020 for the symptoms described in the history of present illness. He was evaluated in the context of the global COVID-19 pandemic, which necessitated consideration that the patient might be at risk for infection with the SARS-CoV-2 virus that causes COVID-19. Institutional protocols and algorithms that pertain to the evaluation of patients at risk for COVID-19 are in a state of rapid change based on information released by regulatory bodies including the CDC and federal and state organizations. These policies and algorithms were followed during the patient's care in the ED.  Some ED evaluations and interventions may be delayed as a result of limited staffing during and the pandemic.*   Note:  This document was prepared using Dragon voice recognition software and may include unintentional dictation errors.    Sable Feil, PA-C 09/26/20 1056    Duffy Bruce,  MD 09/27/20 1200

## 2020-09-26 NOTE — ED Triage Notes (Signed)
Pt c/o lower back pain since yesterday, states he has chronic issues with it. Denies any injury

## 2020-09-26 NOTE — Discharge Instructions (Addendum)
Follow discharge care instruction take medication as directed.  Be advised the only pharmacy available is [facility] on [address] until 2 PM today.
